# Patient Record
Sex: Female | Born: 1990 | Hispanic: No | Marital: Married | Smoking: Never smoker
Health system: Southern US, Community
[De-identification: ages and names within clinical notes are randomized; demographics above are authoritative.]

## PROBLEM LIST (undated history)

## (undated) DIAGNOSIS — Z789 Other specified health status: Secondary | ICD-10-CM

## (undated) HISTORY — PX: NO PAST SURGERIES: SHX2092

---

## 2011-04-26 NOTE — L&D Delivery Note (Signed)
Delivery Note At 3:29 PM a viable and healthy female was delivered via Vaginal, Spontaneous Delivery (Presentation: ; Occiput Anterior).  APGAR:8,9 ; weight 7 lb 11.5 oz (3500 g).   Placenta status: Intact, Spontaneous.  Cord:  with the following complications: nuchal x1, unable to reduce, delivered through "somersualt'.    Anesthesia: None  Episiotomy: None Lacerations: None Est. Blood Loss (mL): 250  Mom to postpartum.  Baby to nursery-stable.  Lawernce Pitts 01/15/2012, 3:47 PM

## 2011-04-26 NOTE — L&D Delivery Note (Signed)
I was present for the delivery and agree with above.  [place], CNM 01/15/2012 4:14 PM

## 2011-11-30 ENCOUNTER — Other Ambulatory Visit (HOSPITAL_COMMUNITY): Payer: Self-pay | Admitting: Physician Assistant

## 2011-11-30 DIAGNOSIS — Z3689 Encounter for other specified antenatal screening: Secondary | ICD-10-CM

## 2011-11-30 LAB — OB RESULTS CONSOLE HIV ANTIBODY (ROUTINE TESTING): HIV: NONREACTIVE

## 2011-11-30 LAB — OB RESULTS CONSOLE ABO/RH: RH Type: POSITIVE

## 2011-11-30 LAB — OB RESULTS CONSOLE RUBELLA ANTIBODY, IGM
Rubella: IMMUNE
Rubella: NON-IMMUNE/NOT IMMUNE

## 2011-12-09 ENCOUNTER — Encounter (HOSPITAL_COMMUNITY): Payer: Self-pay

## 2011-12-09 ENCOUNTER — Ambulatory Visit (HOSPITAL_COMMUNITY)
Admission: RE | Admit: 2011-12-09 | Discharge: 2011-12-09 | Disposition: A | Discharge status: Home / Self Care | Payer: Medicaid Other | Source: Ambulatory Visit | Attending: Physician Assistant | Admitting: Physician Assistant

## 2011-12-09 DIAGNOSIS — O358XX0 Maternal care for other (suspected) fetal abnormality and damage, not applicable or unspecified: Secondary | ICD-10-CM | POA: Insufficient documentation

## 2011-12-09 DIAGNOSIS — Z363 Encounter for antenatal screening for malformations: Secondary | ICD-10-CM | POA: Insufficient documentation

## 2011-12-09 DIAGNOSIS — Z3689 Encounter for other specified antenatal screening: Secondary | ICD-10-CM

## 2011-12-09 DIAGNOSIS — Z1389 Encounter for screening for other disorder: Secondary | ICD-10-CM | POA: Insufficient documentation

## 2011-12-30 LAB — OB RESULTS CONSOLE GC/CHLAMYDIA: Chlamydia: NEGATIVE

## 2012-01-02 LAB — OB RESULTS CONSOLE GBS: GBS: NEGATIVE

## 2012-01-15 ENCOUNTER — Inpatient Hospital Stay (HOSPITAL_COMMUNITY)
Admission: AD | Admit: 2012-01-15 | Discharge: 2012-01-17 | DRG: 775 | Disposition: A | Discharge status: Home / Self Care | Payer: Medicaid Other | Source: Ambulatory Visit | Attending: Obstetrics and Gynecology | Admitting: Obstetrics and Gynecology

## 2012-01-15 ENCOUNTER — Encounter (HOSPITAL_COMMUNITY): Payer: Self-pay | Admitting: Obstetrics and Gynecology

## 2012-01-15 DIAGNOSIS — Z758 Other problems related to medical facilities and other health care: Secondary | ICD-10-CM

## 2012-01-15 DIAGNOSIS — A749 Chlamydial infection, unspecified: Secondary | ICD-10-CM | POA: Diagnosis not present

## 2012-01-15 DIAGNOSIS — O98819 Other maternal infectious and parasitic diseases complicating pregnancy, unspecified trimester: Secondary | ICD-10-CM | POA: Diagnosis not present

## 2012-01-15 DIAGNOSIS — IMO0001 Reserved for inherently not codable concepts without codable children: Secondary | ICD-10-CM

## 2012-01-15 DIAGNOSIS — Z789 Other specified health status: Secondary | ICD-10-CM

## 2012-01-15 DIAGNOSIS — O0930 Supervision of pregnancy with insufficient antenatal care, unspecified trimester: Secondary | ICD-10-CM

## 2012-01-15 HISTORY — DX: Other specified health status: Z78.9

## 2012-01-15 LAB — POCT FERN TEST: Fern Test: POSITIVE

## 2012-01-15 LAB — CBC
HCT: 31.3 % — ABNORMAL LOW (ref 36.0–46.0)
Hemoglobin: 9.6 g/dL — ABNORMAL LOW (ref 12.0–15.0)
MCHC: 30.7 g/dL (ref 30.0–36.0)
MCV: 75.6 fL — ABNORMAL LOW (ref 78.0–100.0)
RDW: 16.5 % — ABNORMAL HIGH (ref 11.5–15.5)

## 2012-01-15 LAB — TYPE AND SCREEN: Antibody Screen: NEGATIVE

## 2012-01-15 MED ORDER — SIMETHICONE 80 MG PO CHEW: 80.0000 mg | CHEWABLE_TABLET | ORAL | Status: DC | PRN

## 2012-01-15 MED ORDER — ONDANSETRON HCL 4 MG/2ML IJ SOLN: 4.0000 mg | INTRAMUSCULAR | Status: DC | PRN

## 2012-01-15 MED ORDER — ONDANSETRON HCL 4 MG PO TABS: 4.0000 mg | ORAL_TABLET | ORAL | Status: DC | PRN

## 2012-01-15 MED ORDER — FLEET ENEMA 7-19 GM/118ML RE ENEM: 1.0000 | ENEMA | Freq: Every day | RECTAL | Status: DC | PRN

## 2012-01-15 MED ORDER — CITRIC ACID-SODIUM CITRATE 334-500 MG/5ML PO SOLN: 30.0000 mL | ORAL | Status: DC | PRN

## 2012-01-15 MED ORDER — IBUPROFEN 600 MG PO TABS
600.0000 mg | ORAL_TABLET | Freq: Four times a day (QID) | ORAL | Status: DC
  Administered 2012-01-15 – 2012-01-17 (×8): 600 mg via ORAL
  Filled 2012-01-15 (×8): qty 1

## 2012-01-15 MED ORDER — IBUPROFEN 600 MG PO TABS: 600.0000 mg | ORAL_TABLET | Freq: Four times a day (QID) | ORAL | Status: DC | PRN

## 2012-01-15 MED ORDER — OXYCODONE-ACETAMINOPHEN 5-325 MG PO TABS
1.0000 | ORAL_TABLET | ORAL | Status: DC | PRN
  Administered 2012-01-16: 1 via ORAL
  Filled 2012-01-15: qty 1

## 2012-01-15 MED ORDER — HYDROXYZINE HCL 50 MG PO TABS: 50.0000 mg | ORAL_TABLET | Freq: Four times a day (QID) | ORAL | Status: DC | PRN

## 2012-01-15 MED ORDER — OXYCODONE-ACETAMINOPHEN 5-325 MG PO TABS: 1.0000 | ORAL_TABLET | ORAL | Status: DC | PRN

## 2012-01-15 MED ORDER — PRENATAL MULTIVITAMIN CH
1.0000 | ORAL_TABLET | Freq: Every day | ORAL | Status: DC
  Administered 2012-01-15 – 2012-01-16 (×2): 1 via ORAL
  Filled 2012-01-15 (×2): qty 1

## 2012-01-15 MED ORDER — WITCH HAZEL-GLYCERIN EX PADS: 1.0000 "application " | MEDICATED_PAD | CUTANEOUS | Status: DC | PRN

## 2012-01-15 MED ORDER — ONDANSETRON HCL 4 MG/2ML IJ SOLN: 4.0000 mg | Freq: Four times a day (QID) | INTRAMUSCULAR | Status: DC | PRN

## 2012-01-15 MED ORDER — DIPHENHYDRAMINE HCL 25 MG PO CAPS: 25.0000 mg | ORAL_CAPSULE | Freq: Four times a day (QID) | ORAL | Status: DC | PRN

## 2012-01-15 MED ORDER — ZOLPIDEM TARTRATE 5 MG PO TABS: 5.0000 mg | ORAL_TABLET | Freq: Every evening | ORAL | Status: DC | PRN

## 2012-01-15 MED ORDER — OXYTOCIN 40 UNITS IN LACTATED RINGERS INFUSION - SIMPLE MED
62.5000 mL/h | Freq: Once | INTRAVENOUS | Status: DC
  Filled 2012-01-15: qty 1000

## 2012-01-15 MED ORDER — LACTATED RINGERS IV SOLN
INTRAVENOUS | Status: DC
  Administered 2012-01-15 (×2): via INTRAVENOUS

## 2012-01-15 MED ORDER — INFLUENZA VIRUS VACC SPLIT PF IM SUSP
0.5000 mL | INTRAMUSCULAR | Status: AC
  Administered 2012-01-16: 0.5 mL via INTRAMUSCULAR
  Filled 2012-01-15: qty 0.5

## 2012-01-15 MED ORDER — ACETAMINOPHEN 325 MG PO TABS: 650.0000 mg | ORAL_TABLET | ORAL | Status: DC | PRN

## 2012-01-15 MED ORDER — BENZOCAINE-MENTHOL 20-0.5 % EX AERO
1.0000 "application " | INHALATION_SPRAY | CUTANEOUS | Status: DC | PRN
  Filled 2012-01-15: qty 56

## 2012-01-15 MED ORDER — NALBUPHINE SYRINGE 5 MG/0.5 ML
5.0000 mg | INJECTION | INTRAMUSCULAR | Status: DC | PRN
  Administered 2012-01-15: 5 mg via INTRAVENOUS
  Filled 2012-01-15: qty 0.5

## 2012-01-15 MED ORDER — DIBUCAINE 1 % RE OINT: 1.0000 "application " | TOPICAL_OINTMENT | RECTAL | Status: DC | PRN

## 2012-01-15 MED ORDER — LANOLIN HYDROUS EX OINT: TOPICAL_OINTMENT | CUTANEOUS | Status: DC | PRN

## 2012-01-15 MED ORDER — SENNOSIDES-DOCUSATE SODIUM 8.6-50 MG PO TABS
2.0000 | ORAL_TABLET | Freq: Every day | ORAL | Status: DC
  Administered 2012-01-15 – 2012-01-16 (×2): 2 via ORAL

## 2012-01-15 MED ORDER — LIDOCAINE HCL (PF) 1 % IJ SOLN
30.0000 mL | INTRAMUSCULAR | Status: DC | PRN
  Filled 2012-01-15: qty 30

## 2012-01-15 MED ORDER — OXYTOCIN BOLUS FROM INFUSION
500.0000 mL | Freq: Once | INTRAVENOUS | Status: DC
  Filled 2012-01-15: qty 500

## 2012-01-15 MED ORDER — LACTATED RINGERS IV SOLN: 500.0000 mL | INTRAVENOUS | Status: DC | PRN

## 2012-01-15 MED ORDER — TETANUS-DIPHTH-ACELL PERTUSSIS 5-2.5-18.5 LF-MCG/0.5 IM SUSP
0.5000 mL | Freq: Once | INTRAMUSCULAR | Status: AC
  Administered 2012-01-16: 0.5 mL via INTRAMUSCULAR
  Filled 2012-01-15: qty 0.5

## 2012-01-15 MED ORDER — HYDROXYZINE HCL 50 MG/ML IM SOLN: 50.0000 mg | Freq: Four times a day (QID) | INTRAMUSCULAR | Status: DC | PRN

## 2012-01-15 NOTE — H&P (Signed)
Stacey Holloway is a 21 y.o. female presenting for SROM brown fluid at 0300. Brought by EMS. Prenatal care at [facility]. Mild UC's. Pos FM.   Patient Active Problem List  Diagnosis  . Insufficient prenatal care  . Chlamydia infection complicating pregnancy  . Language Barrier     History OB History    Grav Para Term Preterm Abortions TAB SAB Ect Mult Living   2 1 1       1      Past Medical History  Diagnosis Date  . No pertinent past medical history    Past Surgical History  Procedure Date  . No past surgeries    Family History: family history is not on file. Social History:  reports that she has never smoked. She does not have any smokeless tobacco history on file. She reports that she does not drink alcohol or use illicit drugs.   Prenatal Transfer Tool  Maternal Diabetes: No 104 Genetic Screening: Neg CF. Too late for quad. Maternal Ultrasounds/Referrals: Normal Fetal Ultrasounds or other Referrals:  None Maternal Substance Abuse:  No Significant Maternal Medications:  None Significant Maternal Lab Results:  None Other Comments:  Pos CT in pregnancy, Tx. Neg TOC.   Review of Systems  Gastrointestinal: Positive for abdominal pain (contractions).  Musculoskeletal: Positive for back pain (contractions).  Neurological: Negative for headaches.      Blood pressure 105/66, pulse 90, temperature 97.8 F (36.6 C), temperature source Oral, resp. rate 18. Maternal Exam:  Uterine Assessment: Contraction strength is moderate.  Contraction frequency is regular.   Abdomen: Fetal presentation: vertex  Introitus: Normal vulva. Normal vagina.  Ferning test: positive.  Amniotic fluid character: clear.  Pelvis: adequate for delivery.   Cervix: Cervix evaluated by digital exam.     Fetal Exam Fetal Monitor Review: Mode: ultrasound.   Baseline rate: 140.  Variability: moderate (6-25 bpm).   Pattern: accelerations present and no decelerations.    Fetal State  Assessment: Category I - tracings are normal.     Physical Exam  Nursing note and vitals reviewed. Constitutional: She is oriented to person, place, and time. She appears well-developed and well-nourished. She appears distressed (mildly).  HENT:  Head: Normocephalic.  Eyes: Pupils are equal, round, and reactive to light.  Cardiovascular: Normal rate, regular rhythm and normal heart sounds.   Respiratory: Breath sounds normal.  GI: Soft. There is no tenderness.  Genitourinary: Vagina normal.  Musculoskeletal: Normal range of motion. She exhibits no edema and no tenderness.  Neurological: She is alert and oriented to person, place, and time. She has normal reflexes.  Skin: Skin is warm and dry.  Psychiatric: She has a normal mood and affect.  Dilation: 3 Effacement (%): 90 Cervical Position: Anterior Station: -1 Presentation: Vertex Exam by:: Ivonne Andrew, CNM   Prenatal labs: ABO, Rh:  O pos Antibody:  neg Rubella:  Immune RPR:   NR HBsAg:   Neg HIV:   NR GBS: Negative (09/09 0000)  1 hour GTT 104 GC/CT: neg/pos, TOC neg/neg CF carrier neg  Assessment: 1. Labor: Eary 2. Fetal Wellbeing: Category I  3. Pain Control: none 4. GBS: neg 5. 39.5 week IUP  Plan:  1. Admit to BS per consult with MD 2. Routine L&D orders 3. Analgesia/anesthesia PRN    Dorathy Kinsman 01/15/2012, 9:23 AM

## 2012-01-15 NOTE — Progress Notes (Signed)
Language line called to complete pt history

## 2012-01-15 NOTE — H&P (Signed)
Attestation of Attending Supervision of Advanced Practitioner: Evaluation and management procedures were performed by the PA/NP/CNM/OB Fellow under my supervision/collaboration. Chart reviewed and agree with management and plan.  Jerrard Bradburn V 01/15/2012 4:53 PM

## 2012-01-15 NOTE — Progress Notes (Signed)
Stacey Holloway is a 21 y.o. G2P1001 at [redacted]w[redacted]d by LMP admitted for active labor, rupture of membranes  Subjective: More uncomfortable w/ UC's.   Objective: BP 105/63  Pulse 101  Temp 98.1 F (36.7 C) (Oral)  Resp 18  Ht 4\' 11"  (1.499 m)  Wt 69.854 kg (154 lb)  BMI 31.10 kg/m2      FHT:  FHR: 130 bpm, variability: moderate,  accelerations:  Present,  decelerations:  Absent UC:   regular, every 2-4 minutes, moderate SVE:   Dilation: 4 Effacement (%): 80 Station: -2 Exam by:: patti moore rn  Labs: Lab Results  Component Value Date   WBC 18.6* 01/15/2012   HGB 9.6* 01/15/2012   HCT 31.3* 01/15/2012   MCV 75.6* 01/15/2012   PLT 265 01/15/2012    Assessment / Plan: Spontaneous labor, progressing normally  Labor: Progressing normally Preeclampsia:  NA Fetal Wellbeing:  Category I Pain Control:  Labor support without medications I/D:  n/a Anticipated MOD:  NSVD  Stacey Holloway 01/15/2012, 1:02 PM

## 2012-01-15 NOTE — MAU Note (Signed)
Pt presents to MAU with chief complaint of rupture of membranes. Pt is [redacted]w[redacted]d- says her water broke at 0300- brown fluid. Pt was brought here by EMS

## 2012-01-16 ENCOUNTER — Encounter (HOSPITAL_COMMUNITY): Payer: Self-pay | Admitting: *Deleted

## 2012-01-16 NOTE — Progress Notes (Signed)
I have seen this patient and agree with the above resident's note.  LEFTWICH-KIRBY, [place] Condon Certified Nurse-Midwife 

## 2012-01-16 NOTE — Progress Notes (Signed)
International interpreter called for [place] [phone] and used for teaching and discharge information. Also asked to call and notify nurse when baby has a stool.

## 2012-01-16 NOTE — Progress Notes (Signed)
Post Partum Day 1  Subjective: no complaints, up ad lib, voiding, tolerating PO and + flatus  Objective: Blood pressure 96/56, pulse 66, temperature 98.7 F (37.1 C), temperature source Axillary, resp. rate 18, height 4\' 11"  (1.499 m), weight 69.854 kg (154 lb), unknown if currently breastfeeding.  Physical Exam:  General: alert, cooperative, appears stated age and no distress CV: 2+ bilateral DP pulses PULM: CTAB Lochia: appropriate Uterine Fundus: firm Incision: n/a DVT Evaluation: No evidence of DVT seen on physical exam. No cords or calf tenderness. No significant calf/ankle edema.   Basename 01/15/12 1015  HGB 9.6*  HCT 31.3*   Assessment/Plan: Plan for discharge tomorrow, bottle-feeding, planning to use implanon/nexplanon.   LOS: 1 day   Simone Curia 01/16/2012, 9:38 AM

## 2012-01-16 NOTE — Progress Notes (Signed)
Ur chart review completed.  

## 2012-01-17 MED ORDER — IBUPROFEN 600 MG PO TABS: 600.0000 mg | ORAL_TABLET | Freq: Four times a day (QID) | ORAL | Status: DC

## 2012-01-17 NOTE — Plan of Care (Signed)
Problem: Discharge Progression Outcomes Goal: MMR given as ordered Outcome: Not Applicable Date Met:  01/17/12 Patient is rubella immune per prenatal record

## 2012-01-17 NOTE — Discharge Summary (Signed)
Obstetric Discharge Summary Reason for Admission: Spontaneous rupture of membranes in early labor Prenatal Procedures: Fetal monitoring Intrapartum Procedures: Spontaneous vaginal delivery Postpartum Procedures: None Complications-Operative and Postpartum: None  Interview and Exam done through phone interpreter.  Hemoglobin  Date Value Range Status  01/15/2012 9.6* 12.0 - 15.0 g/dL Final     HCT  Date Value Range Status  01/15/2012 31.3* 36.0 - 46.0 % Final    Physical Exam:  General: alert, cooperative and no distress Lochia: appropriate Uterine Fundus: firm Incision: n/a DVT Evaluation: No evidence of DVT seen on physical exam.  Discharge Diagnoses: Term Pregnancy-delivered  Discharge Information: Date: 01/17/2012 Activity: pelvic rest Diet: routine Medications: PNV, Ibuprofen, Colace and Iron Condition: stable Instructions: refer to practice specific booklet Discharge to: home   Newborn Data: Live born female  Birth Weight: 7 lb 11.5 oz (3500 g) APGAR: 8,   Home with mother.  Napoleon Form 01/17/2012, 7:38 AM

## 2013-09-28 ENCOUNTER — Emergency Department (HOSPITAL_COMMUNITY)
Admission: EM | Admit: 2013-09-28 | Discharge: 2013-09-28 | Disposition: A | Discharge status: Home / Self Care | Payer: 59 | Attending: Emergency Medicine | Admitting: Emergency Medicine

## 2013-09-28 ENCOUNTER — Encounter (HOSPITAL_COMMUNITY): Payer: Self-pay | Admitting: Emergency Medicine

## 2013-09-28 ENCOUNTER — Emergency Department (HOSPITAL_COMMUNITY): Payer: 59

## 2013-09-28 DIAGNOSIS — R0602 Shortness of breath: Secondary | ICD-10-CM | POA: Insufficient documentation

## 2013-09-28 DIAGNOSIS — R079 Chest pain, unspecified: Secondary | ICD-10-CM | POA: Insufficient documentation

## 2013-09-28 LAB — BASIC METABOLIC PANEL
BUN: 3 mg/dL — ABNORMAL LOW (ref 6–23)
CALCIUM: 8.5 mg/dL (ref 8.4–10.5)
CHLORIDE: 107 meq/L (ref 96–112)
CO2: 23 meq/L (ref 19–32)
CREATININE: 0.59 mg/dL (ref 0.50–1.10)
GFR calc Af Amer: >90 mL/min (ref >90)
GFR calc non Af Amer: >90 mL/min (ref >90)
GLUCOSE: 93 mg/dL (ref 70–99)
Potassium: 4 mEq/L (ref 3.7–5.3)
Sodium: 141 mEq/L (ref 137–147)

## 2013-09-28 LAB — CBC
HCT: 39 % (ref 36.0–46.0)
HEMOGLOBIN: 12.7 g/dL (ref 12.0–15.0)
MCH: 27.8 pg (ref 26.0–34.0)
MCHC: 32.6 g/dL (ref 30.0–36.0)
MCV: 85.3 fL (ref 78.0–100.0)
Platelets: 196 10*3/uL (ref 150–400)
RBC: 4.57 MIL/uL (ref 3.87–5.11)
RDW: 13.8 % (ref 11.5–15.5)
WBC: 4.8 10*3/uL (ref 4.0–10.5)

## 2013-09-28 LAB — I-STAT TROPONIN, ED: TROPONIN I, POC: 0 ng/mL (ref 0.00–0.08)

## 2013-09-28 LAB — PRO B NATRIURETIC PEPTIDE: PRO B NATRI PEPTIDE: 96.5 pg/mL (ref 0–125)

## 2013-09-28 LAB — D-DIMER, QUANTITATIVE (NOT AT ARMC): D DIMER QUANT: 0.29 ug{FEU}/mL (ref 0.00–0.48)

## 2013-09-28 NOTE — ED Notes (Signed)
Patient refuses wheelchair and is walked to lobby with ED Tech.

## 2013-09-28 NOTE — ED Provider Notes (Signed)
CSN: [id number]     Arrival date & time 09/28/13  1005 History   First MD Initiated Contact with Patient 09/28/13 1110     Chief Complaint  Patient presents with  . Chest Pain     (Consider location/radiation/quality/duration/timing/severity/associated sxs/prior Treatment) HPI Comments: Patient is a 23 year old female who presents today with chest pain and shortness of breath since 8 AM this morning. She can only describe the pain as "pain". She does state the pain radiates from her chest into her back. The pain began at rest. It is associated with shortness of breath. The pain was not pleuritic in nature. The episode resolved spontaneously after 20 minutes. She has never had an episode like this in the past. She denies any long trips, recent surgeries, history of DVT or PE, history of cancer, hemoptysis, leg swelling, exogenous estrogen. She has no family history of early heart disease. She has never seen a cardiologist for any reason. She has never smoked. She denies any drug use.   Patient is a 23 y.o. female presenting with chest pain. The history is provided by the patient. The history is limited by a language barrier. A language interpreter was used.  Chest Pain Associated symptoms: shortness of breath   Associated symptoms: no abdominal pain, no diaphoresis, no fever, no nausea and not vomiting     Past Medical History  Diagnosis Date  . No pertinent past medical history    Past Surgical History  Procedure Laterality Date  . No past surgeries     History reviewed. No pertinent family history. History  Substance Use Topics  . Smoking status: Never Smoker   . Smokeless tobacco: Never Used  . Alcohol Use: No   OB History   Grav Para Term Preterm Abortions TAB SAB Ect Mult Living   2 2 2       2      Review of Systems  Constitutional: Negative for fever, chills and diaphoresis.  Respiratory: Positive for shortness of breath.   Cardiovascular: Positive for chest pain.   Gastrointestinal: Negative for nausea, vomiting and abdominal pain.  All other systems reviewed and are negative.     Allergies  Review of patient's allergies indicates no known allergies.  Home Medications   Prior to Admission medications   Medication Sig Start Date End Date Taking? Authorizing Provider  OVER THE COUNTER MEDICATION Take 1-2 tablets by mouth every 6 (six) hours as needed (headache). Over the counter headache medication   Yes Historical Provider, MD   BP 100/74  Pulse 96  Temp(Src) 97.5 F (36.4 C) (Oral)  Resp 16  Wt 157 lb (71.215 kg)  SpO2 100%  LMP 09/20/2013 Physical Exam  Nursing note and vitals reviewed. Constitutional: She is oriented to person, place, and time. She appears well-developed and well-nourished. No distress.  HENT:  Head: Normocephalic and atraumatic.  Right Ear: External ear normal.  Left Ear: External ear normal.  Nose: Nose normal.  Mouth/Throat: Oropharynx is clear and moist.  Eyes: Conjunctivae are normal.  Neck: Normal range of motion.  Cardiovascular: Normal rate, regular rhythm and normal heart sounds.   Pulmonary/Chest: Effort normal and breath sounds normal. No stridor. No respiratory distress. She has no wheezes. She has no rales.  Abdominal: Soft. She exhibits no distension.  Musculoskeletal: Normal range of motion.  Neurological: She is alert and oriented to person, place, and time. She has normal strength.  Skin: Skin is warm and dry. She is not diaphoretic. No erythema.  Psychiatric:  She has a normal mood and affect. Her behavior is normal.    ED Course  Procedures (including critical care time) Labs Review Labs Reviewed  BASIC METABOLIC PANEL - Abnormal; Notable for the following:    BUN 3 (*)    All other components within normal limits  CBC  PRO B NATRIURETIC PEPTIDE  D-DIMER, QUANTITATIVE  I-STAT TROPOININ, ED    Imaging Review Dg Chest 2 View  09/28/2013   CLINICAL DATA:  Centralized chest pain.  EXAM:  CHEST  2 VIEW  COMPARISON:  No priors.  FINDINGS: Lung volumes are normal. No consolidative airspace disease. No pleural effusions. No pneumothorax. No pulmonary nodule or mass noted. Pulmonary vasculature and the cardiomediastinal silhouette are within normal limits.  IMPRESSION: No radiographic evidence of acute cardiopulmonary disease.   Electronically Signed   By: Trudie Reedaniel  Entrikin M.D.   On: 09/28/2013 12:20     EKG Interpretation   Date/Time:  Saturday September 28 2013 10:10:12 EDT Ventricular Rate:  97 PR Interval:  134 QRS Duration: 82 QT Interval:  364 QTC Calculation: 462 R Axis:   85 Text Interpretation:  Normal sinus rhythm with sinus arrhythmia Normal ECG  No old tracing to compare Confirmed by [facility]  MD, MARTHA [phone] on  09/28/2013 12:56:35 PM      MDM   Final diagnoses:  Chest pain    Patient is to be discharged with recommendation to follow up with PCP in regards to today's hospital visit. Chest pain is not likely of cardiac or pulmonary etiology d/t presentation, negative d dimer, VSS, no tracheal deviation, no JVD or new murmur, RRR, breath sounds equal bilaterally, EKG without acute abnormalities, negative troponin, and negative CXR. Pt has been advised start a PPI and return to the ED is CP becomes exertional, associated with diaphoresis or nausea, radiates to left jaw/arm, worsens or becomes concerning in any way. Pt appears reliable for follow up and is agreeable to discharge.   Case has been discussed with Dr. Karma GanjaLinker who agrees with the above plan to discharge.     Mora BellmanHannah S Gailene Youkhana, PA-C 09/28/13 1306

## 2013-09-28 NOTE — Discharge Instructions (Signed)
Chest Pain (Nonspecific) °It is often hard to give a specific diagnosis for the cause of chest pain. There is always a chance that your pain could be related to something serious, such as a heart attack or a blood clot in the lungs. You need to follow up with your caregiver for further evaluation. °CAUSES  °· Heartburn. °· Pneumonia or bronchitis. °· Anxiety or stress. °· Inflammation around your heart (pericarditis) or lung (pleuritis or pleurisy). °· A blood clot in the lung. °· A collapsed lung (pneumothorax). It can develop suddenly on its own (spontaneous pneumothorax) or from injury (trauma) to the chest. °· Shingles infection (herpes zoster virus). °The chest wall is composed of bones, muscles, and cartilage. Any of these can be the source of the pain. °· The bones can be bruised by injury. °· The muscles or cartilage can be strained by coughing or overwork. °· The cartilage can be affected by inflammation and become sore (costochondritis). °DIAGNOSIS  °Lab tests or other studies, such as X-rays, electrocardiography, stress testing, or cardiac imaging, may be needed to find the cause of your pain.  °TREATMENT  °· Treatment depends on what may be causing your chest pain. Treatment may include: °· Acid blockers for heartburn. °· Anti-inflammatory medicine. °· Pain medicine for inflammatory conditions. °· Antibiotics if an infection is present. °· You may be advised to change lifestyle habits. This includes stopping smoking and avoiding alcohol, caffeine, and chocolate. °· You may be advised to keep your head raised (elevated) when sleeping. This reduces the chance of acid going backward from your stomach into your esophagus. °· Most of the time, nonspecific chest pain will improve within 2 to 3 days with rest and mild pain medicine. °HOME CARE INSTRUCTIONS  °· If antibiotics were prescribed, take your antibiotics as directed. Finish them even if you start to feel better. °· For the next few days, avoid physical  activities that bring on chest pain. Continue physical activities as directed. °· Do not smoke. °· Avoid drinking alcohol. °· Only take over-the-counter or prescription medicine for pain, discomfort, or fever as directed by your caregiver. °· Follow your caregiver's suggestions for further testing if your chest pain does not go away. °· Keep any follow-up appointments you made. If you do not go to an appointment, you could develop lasting (chronic) problems with pain. If there is any problem keeping an appointment, you must call to reschedule. °SEEK MEDICAL CARE IF:  °· You think you are having problems from the medicine you are taking. Read your medicine instructions carefully. °· Your chest pain does not go away, even after treatment. °· You develop a rash with blisters on your chest. °SEEK IMMEDIATE MEDICAL CARE IF:  °· You have increased chest pain or pain that spreads to your arm, neck, jaw, back, or abdomen. °· You develop shortness of breath, an increasing cough, or you are coughing up blood. °· You have severe back or abdominal pain, feel nauseous, or vomit. °· You develop severe weakness, fainting, or chills. °· You have a fever. °THIS IS AN EMERGENCY. Do not wait to see if the pain will go away. Get medical help at once. Call your local emergency services (911 in [place] Do not drive yourself to the hospital. °MAKE SURE YOU:  °· Understand these instructions. °· Will watch your condition. °· Will get help right away if you are not doing well or get worse. °Document Released: 01/19/2005 Document Revised: 07/04/2011 Document Reviewed: 11/15/2007 °ExitCare® Patient Information ©2014 ExitCare,   LLC. ° ° ° °Emergency Department Resource Guide °1) Find a Doctor and Pay Out of Pocket °Although you won't have to find out who is covered by your insurance plan, it is a good idea to ask around and get recommendations. You will then need to call the office and see if the doctor you have chosen will accept you as a new  patient and what types of options they offer for patients who are self-pay. Some doctors offer discounts or will set up payment plans for their patients who do not have insurance, but you will need to ask so you aren't surprised when you get to your appointment. ° °2) Contact Your Local Health Department °Not all health departments have doctors that can see patients for sick visits, but many do, so it is worth a call to see if yours does. If you don't know where your local health department is, you can check in your phone book. The CDC also has a tool to help you locate your state's health department, and many state websites also have listings of all of their local health departments. ° °3) Find a Walk-in Clinic °If your illness is not likely to be very severe or complicated, you may want to try a walk in clinic. These are popping up all over the country in pharmacies, drugstores, and shopping centers. They're usually staffed by nurse practitioners or physician assistants that have been trained to treat common illnesses and complaints. They're usually fairly quick and inexpensive. However, if you have serious medical issues or chronic medical problems, these are probably not your best option. ° °No Primary Care Doctor: °- Call [facility] at  832-8000 - they can help you locate a primary care doctor that  accepts your insurance, provides certain services, etc. °- Physician Referral Service- 1-800-533-3463 ° °Chronic Pain Problems: °Organization         Address  Phone   Notes  °[facility]  (336) 297-2271 Patients need to be referred by their primary care doctor.  ° °Medication Assistance: °Organization         Address  Phone   Notes  °[place] [facility] [address] °[place], [place] [address] (336) 641-8030 --Must be a resident of [place] °-- Must have NO insurance coverage whatsoever (no Medicaid/ Medicare, etc.) °-- The pt. MUST have a primary  care doctor that directs their care regularly and follows them in the community °  °[facility]  (866) 331-1348   °[employer]  (888) 892-1162   ° °Agencies that provide inexpensive medical care: °Organization         Address  Phone   Notes  °[facility]  (336) 832-8035   °[facility]    (336) 832-7272   °[facility] [address] °[place], [place] [address] (336) 832-4777   °[facility] [address], °[place] (336) 271-4999   °[facility]    (336) 373-0678   °[facility]    (336) 272-1050   °[facility] ° [address], [place] Phone:  (336) 832-4444, Fax:  (336) 832-4440 Hours of Operation:  9 am - 6 pm, M-F.  Also accepts Medicaid/Medicare and self-pay.  °[facility] ° [address], [place] Phone: (336) 832-3150, Fax: (336) 832-3151. Hours of Operation:  8:30 am - 5:30 pm, M-F.  Also accepts Medicaid and self-pay.  °  [facility] [place] [address], [place] Phone: (336) 878-6027   °[facility] [address], [place], [place] (336)723-1848, Ext. 123 Mondays & Thursdays: 7-9 AM.  First 15 patients are seen on a first come, first serve basis. °  ° °Medicaid-accepting [place] Providers: ° °Organization         Address  Phone   Notes  °[facility] [address], [place] (336) 641-2100 Also accepts self-pay patients.  °[facility] [address], [place] ° (336) 856-9996   °[facility] [address], [place] (336) 288-8857   °[facility] [address] [place] [address], [place] (336) 299-7000   °Veita Bland [address], [place]  ° (336) 373-1557 Only accepts [place] Access Medicaid patients after they have their name applied to their card.  ° °Self-Pay (no insurance) in [place]: ° °Organization         Address  Phone   Notes  °Sickle Cell Patients, [facility] [address], [place] (336) 832-1970   °[facility] [address], [place] (336) 832-4400   °[facility] ° [address], [place] (336) 992-4800   °[facility]/Dr. Osei-Bonsu ° [address] [place] [address], [place] or [address], [place] (336) 841-8500 Phone number for both [place] and [place] locations is the same.  °[facility] [address], [place] (336) 299-0000   °[facility] [address] [place] [address], [place] or [address] (336) 852-7530 °(336) 878-2260   °[facility] [address], [place] (336) 350-1642, phone; (336) 294-5005, fax Sees patients 1st and 3rd Saturday of every month.  Must not qualify for public or private insurance (i.e. Medicaid, Medicare, [place] Health Choice, Veterans' Benefits) • Household income should be no more than 200% of the poverty level •The clinic cannot treat you if you are pregnant or think you are pregnant • Sexually transmitted diseases are not treated at the clinic.  ° ° °Dental Care: °Organization         Address  Phone  Notes  °[facility] [address], [place] (336) 641-6152 Accepts children up to age 21 who are enrolled in Medicaid or [place] Health Choice; pregnant women with a Medicaid card; and children who have applied for Medicaid or [place] Health Choice, but were declined, whose parents can pay a reduced fee at time of service.  °[facility] [place]  [address], [place] (336) 641-7733 Accepts children up to age 21 who are enrolled in Medicaid or [place] Health Choice; pregnant women with a Medicaid card; and children who have applied for Medicaid or [place] Health Choice, but were declined, whose parents can  pay a reduced fee at time of service.  °[facility] ° [address], [place] (336) 641-4533 Patients are seen by appointment only. Walk-ins are not accepted. [facility] will see patients 18 years of age and older. °Monday - Tuesday (8am-5pm) °Most Wednesdays (8:30-5pm) °$30 per visit, cash only  °[facility] ° [address], [place] (336) 641-4533 Patients are seen by appointment only. Walk-ins are not accepted. [facility] will see patients 18 years of   age and older. °One Wednesday Evening (Monthly: Volunteer Based).  $30 per visit, cash only  °[facility]  (919) 537-3737 for adults; Children under age 4, call Graduate Pediatric Dentistry at (919) [phone]. Children aged 4-14, please call (919) [phone] to request a pediatric application. ° Dental services are provided in all areas of dental care including fillings, crowns and bridges, complete and partial dentures, implants, gum treatment, root canals, and extractions. Preventive care is also provided. Treatment is provided to both adults and children. °Patients are selected via a lottery and there is often a waiting list. °  °[facility] [address], °[place] ° (336) 763-8833 [email or web address] °  °[facility] [address], [place], [place] (336)723-1848, Ext. 123 Second and Fourth Thursday of each month, opens at 6:30 AM; Clinic ends at 9 AM.  Patients are seen on a first-come first-served basis, and a limited number are seen during each clinic.  ° °[facility] ° [address], [place], [place] (336) 723-7904   Eligibility Requirements °You must have lived in [place], [place], or [place] counties for at least the last three months. °  You cannot be eligible for state or federal sponsored healthcare insurance, including Veterans Administration, Medicaid, or Medicare. °  You generally cannot be eligible for healthcare  insurance through your employer.  °  How to apply: °Eligibility screenings are held every Tuesday and Wednesday afternoon from 1:00 pm until 4:00 pm. You do not need an appointment for the interview!  °[facility] [address], [place], [place] 336-631-2330   °[facility]  336-342-8273   °[facility]  336-703-3100   °[facility]  336-570-6415   ° °Behavioral Health Resources in the Community: °Intensive Outpatient Programs °Organization         Address  Phone  Notes  °[facility] [address], [place], [place] 336-878-6098   °[facility] [address], [place], [place] 336-832-9800   °[facility] [address], [place], [place] ° 336-882-2125   °[facility] [address],  °[place], [place] 1-800-853-5163 or 336-641-4981   °Substance Abuse Resources °Organization         Address  Phone  Notes  °[facility]  336-882-2125   °[facility]  336-784-9470   °[facility]  336-285-9073   °[facility]  336-845-3988   °Residential & Outpatient Substance Abuse Program  1-800-659-3381   °Psychological Services °Organization         Address  Phone  Notes  °[facility]  336- 832-9600   °[facility]  336- 378-7881   °[facility] [address], [place] 1-800-853-5163 or 336-641-4981   ° °Mobile Crisis Teams °Organization         Address  Phone  Notes  °[facility], Mobile Crisis Care Unit  1-877-626-1772   °Assertive °[facility] ° [address] [place], [place] 336-834-9664   °Sharon DeEsch [address] °[place] 336-554-5454   ° °Self-Help/Support Groups °Organization         Address  Phone             Notes  °[facility]  - variety of support groups  336- 373-1402 Call for more information  °Narcotics Anonymous (NA),  [facility] [address], °[place]   2   meetings at this location  ° °Residential Treatment Programs °Organization         Address  Phone  Notes  °[facility] [address],    °[place]  1-866-801-8205   °[facility] ° [address], [place], [place] 704-293-8524   °[facility] [address], [place] 336-845-3988 Admissions: 8am-3pm M-F  °[facility] [address]    °[place], [place] 336-841-1104   °[facility] [address], [place], [place] 336-379-7146   °[facility] [address]  °[place], [place] 336-285-9073   °[facility] Intensive Outpatient [address], [place], [place] 336-852-3033   °[facility] [address]  °[place], [place] 1-877-615-2722 or 336-784-9470   °[facility] ([facility] [address] [place], [place] 336-227-7417 Accepts Medicaid  °[facility] [address]  °[place] 1-800-659-3381 Substance Abuse/Addiction Treatment  ° °[place] Behavioral Health Resources °Organization         Address  Phone  Notes  °[facility]  (888) 581-9988   °Julie Brannon, PhD [address] [place], [place]   (336) 349-5553 or (336) 951-0000   °[facility]   [address] °[place], [place] (336) 349-4454   °[facility] [address], [place], [place] (336) 342-8316 Insurance/Medicaid/sponsorship through [facility]  °[facility] [address]                                    [place], [place] (336) 342-8316 Therapy/tele-psych/case  °[facility] [address]  ° [place], [place] (336) 349-2233    °Dr. Arfeen  (336) 349-4544   °[facility] [place]  [employer] [place] [facility] 1) [address], [place] °2) [address], [place] °3)  [address], [place] (336) 349-3220 °(336) 342-7768 ° °(336) 342-8140    °[place] [facility] (336) 342-1394 or (336) 342-3537 (After Hours)    ° ° ° °

## 2013-09-28 NOTE — ED Notes (Signed)
Phlebotomy and pharmacy in room at this time.

## 2013-09-28 NOTE — ED Provider Notes (Signed)
Medical screening examination/treatment/procedure(s) were performed by non-physician practitioner and as supervising physician I was immediately available for consultation/collaboration.   EKG Interpretation   Date/Time:  Saturday September 28 2013 10:10:12 EDT Ventricular Rate:  97 PR Interval:  134 QRS Duration: 82 QT Interval:  364 QTC Calculation: 462 R Axis:   85 Text Interpretation:  Normal sinus rhythm with sinus arrhythmia Normal ECG  No old tracing to compare Confirmed by [facility]  MD, Lakoda Mcanany [id number]) on  09/28/2013 12:56:35 PM       Ethelda Chick, MD 09/28/13 1310

## 2013-09-28 NOTE — ED Notes (Addendum)
Shes had chest pain and "feels hard to breathe" since yesterday. She denies any medical history. she speaks nepali

## 2014-02-24 ENCOUNTER — Encounter (HOSPITAL_COMMUNITY): Payer: Self-pay | Admitting: Emergency Medicine

## 2014-04-25 NOTE — L&D Delivery Note (Cosign Needed)
Delivery Note At 9:18 PM a viable and healthy female was delivered via Vaginal, Spontaneous Delivery (Presentation: Left Occiput Anterior).  APGAR: 9, 9; weight  .   Placenta status: Intact, Spontaneous.  Cord: 3 vessels with the following complications: None.   Anesthesia: None  Episiotomy: None Lacerations: None Suture Repair: n/a Est. Blood Loss (mL):  250mL  Mom to postpartum.  Baby to Couplet care / Skin to Skin. Viable female delivered over intact perineum. Mildly stunned but cried with vigorous stimulation and suctioning.   [name], Elena 07/25/2014, 10:14 PM   Patient is a G3P2001 (death of one child) at [id number] who was admitted w/ SOL, uncomplicated prenatal course.  She progressed to SVD shortly after admission.  I was gloved and present for delivery in its entirety.  Second stage of labor progressed, baby delivered after a few contractions.    Complications: none  Lacerations: none  EBL: 250cc  Bostyn Kunkler, CNM 11:14 PM

## 2014-04-28 ENCOUNTER — Other Ambulatory Visit (HOSPITAL_COMMUNITY): Payer: Self-pay | Admitting: Nurse Practitioner

## 2014-04-28 DIAGNOSIS — Z3689 Encounter for other specified antenatal screening: Secondary | ICD-10-CM

## 2014-04-28 LAB — OB RESULTS CONSOLE HEPATITIS B SURFACE ANTIGEN: Hepatitis B Surface Ag: NEGATIVE

## 2014-04-28 LAB — OB RESULTS CONSOLE ANTIBODY SCREEN: ANTIBODY SCREEN: NEGATIVE

## 2014-04-28 LAB — OB RESULTS CONSOLE HIV ANTIBODY (ROUTINE TESTING): HIV: NONREACTIVE

## 2014-04-28 LAB — OB RESULTS CONSOLE RPR: RPR: NONREACTIVE

## 2014-04-28 LAB — OB RESULTS CONSOLE GC/CHLAMYDIA
CHLAMYDIA, DNA PROBE: NEGATIVE
Gonorrhea: NEGATIVE

## 2014-04-28 LAB — OB RESULTS CONSOLE ABO/RH: RH TYPE: POSITIVE

## 2014-04-28 LAB — OB RESULTS CONSOLE RUBELLA ANTIBODY, IGM: Rubella: IMMUNE

## 2014-05-02 ENCOUNTER — Other Ambulatory Visit (HOSPITAL_COMMUNITY): Payer: Self-pay | Admitting: Nurse Practitioner

## 2014-05-02 ENCOUNTER — Ambulatory Visit (HOSPITAL_COMMUNITY)
Admission: RE | Admit: 2014-05-02 | Discharge: 2014-05-02 | Disposition: A | Discharge status: Home / Self Care | Payer: 59 | Source: Ambulatory Visit | Attending: Nurse Practitioner | Admitting: Nurse Practitioner

## 2014-05-02 ENCOUNTER — Encounter (HOSPITAL_COMMUNITY): Payer: Self-pay

## 2014-05-02 DIAGNOSIS — Z3A27 27 weeks gestation of pregnancy: Secondary | ICD-10-CM | POA: Insufficient documentation

## 2014-05-02 DIAGNOSIS — Z36 Encounter for antenatal screening of mother: Secondary | ICD-10-CM | POA: Insufficient documentation

## 2014-05-02 DIAGNOSIS — Z3689 Encounter for other specified antenatal screening: Secondary | ICD-10-CM | POA: Insufficient documentation

## 2014-05-02 DIAGNOSIS — O402XX0 Polyhydramnios, second trimester, not applicable or unspecified: Secondary | ICD-10-CM | POA: Insufficient documentation

## 2014-05-07 ENCOUNTER — Other Ambulatory Visit (HOSPITAL_COMMUNITY): Payer: Self-pay | Admitting: Nurse Practitioner

## 2014-05-07 DIAGNOSIS — Z0371 Encounter for suspected problem with amniotic cavity and membrane ruled out: Secondary | ICD-10-CM

## 2014-05-07 DIAGNOSIS — Z3689 Encounter for other specified antenatal screening: Secondary | ICD-10-CM

## 2014-05-23 ENCOUNTER — Ambulatory Visit (HOSPITAL_COMMUNITY)
Admission: RE | Admit: 2014-05-23 | Discharge: 2014-05-23 | Disposition: A | Discharge status: Home / Self Care | Payer: 59 | Source: Ambulatory Visit | Attending: Nurse Practitioner | Admitting: Nurse Practitioner

## 2014-05-23 DIAGNOSIS — Z3A30 30 weeks gestation of pregnancy: Secondary | ICD-10-CM | POA: Insufficient documentation

## 2014-05-23 DIAGNOSIS — Z0371 Encounter for suspected problem with amniotic cavity and membrane ruled out: Secondary | ICD-10-CM

## 2014-05-23 DIAGNOSIS — IMO0002 Reserved for concepts with insufficient information to code with codable children: Secondary | ICD-10-CM | POA: Insufficient documentation

## 2014-05-23 DIAGNOSIS — Z0489 Encounter for examination and observation for other specified reasons: Secondary | ICD-10-CM | POA: Insufficient documentation

## 2014-05-23 DIAGNOSIS — Z3689 Encounter for other specified antenatal screening: Secondary | ICD-10-CM

## 2014-07-04 LAB — OB RESULTS CONSOLE GBS: STREP GROUP B AG: NEGATIVE

## 2014-07-25 ENCOUNTER — Inpatient Hospital Stay (HOSPITAL_COMMUNITY)
Admission: AD | Admit: 2014-07-25 | Discharge: 2014-07-27 | DRG: 775 | Disposition: A | Discharge status: Home / Self Care | Payer: Medicaid Other | Source: Ambulatory Visit | Attending: Obstetrics & Gynecology | Admitting: Obstetrics & Gynecology

## 2014-07-25 ENCOUNTER — Encounter (HOSPITAL_COMMUNITY): Payer: Self-pay

## 2014-07-25 ENCOUNTER — Encounter (HOSPITAL_COMMUNITY): Payer: Self-pay | Admitting: *Deleted

## 2014-07-25 ENCOUNTER — Inpatient Hospital Stay (HOSPITAL_COMMUNITY)
Admission: AD | Admit: 2014-07-25 | Discharge: 2014-07-25 | Disposition: A | Discharge status: Home / Self Care | Payer: Medicaid Other | Source: Ambulatory Visit | Attending: Obstetrics & Gynecology | Admitting: Obstetrics & Gynecology

## 2014-07-25 DIAGNOSIS — Z3A39 39 weeks gestation of pregnancy: Secondary | ICD-10-CM | POA: Diagnosis not present

## 2014-07-25 DIAGNOSIS — O4103X0 Oligohydramnios, third trimester, not applicable or unspecified: Principal | ICD-10-CM | POA: Diagnosis present

## 2014-07-25 DIAGNOSIS — IMO0001 Reserved for inherently not codable concepts without codable children: Secondary | ICD-10-CM

## 2014-07-25 HISTORY — DX: Other specified health status: Z78.9

## 2014-07-25 LAB — TYPE AND SCREEN
ABO/RH(D): O POS
Antibody Screen: NEGATIVE

## 2014-07-25 LAB — CBC
HCT: 30.3 % — ABNORMAL LOW (ref 36.0–46.0)
Hemoglobin: 9.4 g/dL — ABNORMAL LOW (ref 12.0–15.0)
MCH: 23.6 pg — ABNORMAL LOW (ref 26.0–34.0)
MCHC: 31 g/dL (ref 30.0–36.0)
MCV: 76.1 fL — ABNORMAL LOW (ref 78.0–100.0)
Platelets: 243 10*3/uL (ref 150–400)
RBC: 3.98 MIL/uL (ref 3.87–5.11)
RDW: 16.5 % — ABNORMAL HIGH (ref 11.5–15.5)
WBC: 17.5 10*3/uL — ABNORMAL HIGH (ref 4.0–10.5)

## 2014-07-25 LAB — AMNISURE RUPTURE OF MEMBRANE (ROM) NOT AT ARMC: Amnisure ROM: NEGATIVE

## 2014-07-25 MED ORDER — OXYCODONE-ACETAMINOPHEN 5-325 MG PO TABS
1.0000 | ORAL_TABLET | ORAL | Status: DC | PRN
  Administered 2014-07-26: 1 via ORAL
  Filled 2014-07-25: qty 1

## 2014-07-25 MED ORDER — LIDOCAINE HCL (PF) 1 % IJ SOLN
30.0000 mL | INTRAMUSCULAR | Status: DC | PRN
  Filled 2014-07-25: qty 30

## 2014-07-25 MED ORDER — LANOLIN HYDROUS EX OINT: TOPICAL_OINTMENT | CUTANEOUS | Status: DC | PRN

## 2014-07-25 MED ORDER — OXYTOCIN 40 UNITS IN LACTATED RINGERS INFUSION - SIMPLE MED
62.5000 mL/h | INTRAVENOUS | Status: DC
  Administered 2014-07-25: 62.5 mL/h via INTRAVENOUS
  Filled 2014-07-25: qty 1000

## 2014-07-25 MED ORDER — PRENATAL MULTIVITAMIN CH
1.0000 | ORAL_TABLET | Freq: Every day | ORAL | Status: DC
  Administered 2014-07-27: 1 via ORAL
  Filled 2014-07-25 (×2): qty 1

## 2014-07-25 MED ORDER — WITCH HAZEL-GLYCERIN EX PADS: 1.0000 "application " | MEDICATED_PAD | CUTANEOUS | Status: DC | PRN

## 2014-07-25 MED ORDER — IBUPROFEN 600 MG PO TABS
600.0000 mg | ORAL_TABLET | Freq: Four times a day (QID) | ORAL | Status: DC
  Administered 2014-07-25 – 2014-07-27 (×6): 600 mg via ORAL
  Filled 2014-07-25 (×7): qty 1

## 2014-07-25 MED ORDER — OXYCODONE-ACETAMINOPHEN 5-325 MG PO TABS: 2.0000 | ORAL_TABLET | ORAL | Status: DC | PRN

## 2014-07-25 MED ORDER — BENZOCAINE-MENTHOL 20-0.5 % EX AERO
1.0000 "application " | INHALATION_SPRAY | CUTANEOUS | Status: DC | PRN
  Administered 2014-07-25: 1 via TOPICAL
  Filled 2014-07-25: qty 56

## 2014-07-25 MED ORDER — ZOLPIDEM TARTRATE 5 MG PO TABS: 5.0000 mg | ORAL_TABLET | Freq: Every evening | ORAL | Status: DC | PRN

## 2014-07-25 MED ORDER — CITRIC ACID-SODIUM CITRATE 334-500 MG/5ML PO SOLN: 30.0000 mL | ORAL | Status: DC | PRN

## 2014-07-25 MED ORDER — ACETAMINOPHEN 325 MG PO TABS: 650.0000 mg | ORAL_TABLET | ORAL | Status: DC | PRN

## 2014-07-25 MED ORDER — LACTATED RINGERS IV SOLN
INTRAVENOUS | Status: DC
  Administered 2014-07-25: 20:00:00 via INTRAVENOUS

## 2014-07-25 MED ORDER — SENNOSIDES-DOCUSATE SODIUM 8.6-50 MG PO TABS
2.0000 | ORAL_TABLET | ORAL | Status: DC
Start: 2014-07-26 — End: 2014-07-27
  Administered 2014-07-25: 2 via ORAL
  Filled 2014-07-25: qty 2

## 2014-07-25 MED ORDER — ONDANSETRON HCL 4 MG PO TABS: 4.0000 mg | ORAL_TABLET | ORAL | Status: DC | PRN

## 2014-07-25 MED ORDER — TETANUS-DIPHTH-ACELL PERTUSSIS 5-2.5-18.5 LF-MCG/0.5 IM SUSP: 0.5000 mL | Freq: Once | INTRAMUSCULAR | Status: DC

## 2014-07-25 MED ORDER — ONDANSETRON HCL 4 MG/2ML IJ SOLN
4.0000 mg | INTRAMUSCULAR | Status: DC | PRN
Start: 2014-07-25 — End: 2014-07-27

## 2014-07-25 MED ORDER — DIBUCAINE 1 % RE OINT: 1.0000 "application " | TOPICAL_OINTMENT | RECTAL | Status: DC | PRN

## 2014-07-25 MED ORDER — FLEET ENEMA 7-19 GM/118ML RE ENEM: 1.0000 | ENEMA | RECTAL | Status: DC | PRN

## 2014-07-25 MED ORDER — SIMETHICONE 80 MG PO CHEW: 80.0000 mg | CHEWABLE_TABLET | ORAL | Status: DC | PRN

## 2014-07-25 MED ORDER — LACTATED RINGERS IV SOLN: 500.0000 mL | INTRAVENOUS | Status: DC | PRN

## 2014-07-25 MED ORDER — ONDANSETRON HCL 4 MG/2ML IJ SOLN: 4.0000 mg | Freq: Four times a day (QID) | INTRAMUSCULAR | Status: DC | PRN

## 2014-07-25 MED ORDER — DIPHENHYDRAMINE HCL 25 MG PO CAPS: 25.0000 mg | ORAL_CAPSULE | Freq: Four times a day (QID) | ORAL | Status: DC | PRN

## 2014-07-25 MED ORDER — OXYTOCIN BOLUS FROM INFUSION
500.0000 mL | INTRAVENOUS | Status: DC
  Administered 2014-07-25: 500 mL via INTRAVENOUS

## 2014-07-25 MED ORDER — OXYCODONE-ACETAMINOPHEN 5-325 MG PO TABS: 1.0000 | ORAL_TABLET | ORAL | Status: DC | PRN

## 2014-07-25 NOTE — MAU Note (Signed)
Pt here via EMS for possible ROM.

## 2014-07-25 NOTE — Progress Notes (Signed)
LClemmons, CNM notified of pt's c/o ?ROM, [facility]

## 2014-07-25 NOTE — Discharge Instructions (Signed)
Third Trimester of Pregnancy The third trimester is from week 29 through week 42, months 7 through 9. This trimester is when your unborn baby (fetus) is growing very fast. At the end of the ninth month, the unborn baby is about 20 inches in length. It weighs about 6-10 pounds.  HOME CARE   Avoid all smoking, herbs, and alcohol. Avoid drugs not approved by your doctor.  Only take medicine as told by your doctor. Some medicines are safe and some are not during pregnancy.  Exercise only as told by your doctor. Stop exercising if you start having cramps.  Eat regular, healthy meals.  Wear a good support bra if your breasts are tender.  Do not use hot tubs, steam rooms, or saunas.  Wear your seat belt when driving.  Avoid raw meat, uncooked cheese, and liter boxes and soil used by cats.  Take your prenatal vitamins.  Try taking medicine that helps you poop (stool softener) as needed, and if your doctor approves. Eat more fiber by eating fresh fruit, vegetables, and whole grains. Drink enough fluids to keep your pee (urine) clear or pale yellow.  Take warm water baths (sitz baths) to soothe pain or discomfort caused by hemorrhoids. Use hemorrhoid cream if your doctor approves.  If you have puffy, bulging veins (varicose veins), wear support hose. Raise (elevate) your feet for 15 minutes, 3-4 times a day. Limit salt in your diet.  Avoid heavy lifting, wear low heels, and sit up straight.  Rest with your legs raised if you have leg cramps or low back pain.  Visit your dentist if you have not gone during your pregnancy. Use a soft toothbrush to brush your teeth. Be gentle when you floss.  You can have sex (intercourse) unless your doctor tells you not to.  Do not travel far distances unless you must. Only do so with your doctor's approval.  Take prenatal classes.  Practice driving to the hospital.  Pack your hospital bag.  Prepare the baby's room.  Go to your doctor visits. GET  HELP IF:  You are not sure if you are in labor or if your water has broken.  You are dizzy.  You have mild cramps or pressure in your lower belly (abdominal).  You have a nagging pain in your belly area.  You continue to feel sick to your stomach (nauseous), throw up (vomit), or have watery poop (diarrhea).  You have bad smelling fluid coming from your vagina.  You have pain with peeing (urination). GET HELP RIGHT AWAY IF:   You have a fever.  You are leaking fluid from your vagina.  You are spotting or bleeding from your vagina.  You have severe belly cramping or pain.  You lose or gain weight rapidly.  You have trouble catching your breath and have chest pain.  You notice sudden or extreme puffiness (swelling) of your face, hands, ankles, feet, or legs.  You have not felt the baby move in over an hour.  You have severe headaches that do not go away with medicine.  You have vision changes. Document Released: 07/06/2009 Document Revised: 08/06/2012 Document Reviewed: 06/12/2012 ExitCare Patient Information 2015 [employer]. This information is not intended to replace advice given to you by your health care provider. Make sure you discuss any questions you have with your health care provider.  

## 2014-07-25 NOTE — MAU Note (Signed)
Patient was seen in MAU earlier today and came back by ambulance for worsening contractions.

## 2014-07-25 NOTE — H&P (Signed)
LABOR ADMISSION HISTORY AND PHYSICAL  Stacey Holloway is a 24 y.o. female G64P2002 with spontaneous onset of labor at [redacted]w[redacted]d by LMP confirmed by [place]. She reports +FMs, No LOF, no VB, no blurry vision, headaches or peripheral edema, and RUQ pain.  Pt noticed increased mucous discharge this morning.  She plans on bottle feeding. She request OCP for birth control.  Dating: By LMP confirmed by [place] --->  Estimated Date of Delivery: 07/31/14  Sono:    , CWD, normal anatomy, breech presentation, 1623g, 64% EFW   Prenatal History/Complications:  Past Medical History: Past Medical History  Diagnosis Date  . No pertinent past medical history     Past Surgical History: Past Surgical History  Procedure Laterality Date  . No past surgeries      Obstetrical History: OB History    Gravida Para Term Preterm AB TAB SAB Ectopic Multiple Living   Social History: History   Social History  . Marital Status: Married    Spouse Name: N/A  . Number of Children: N/A  . Years of Education: N/A   Social History Main Topics  . Smoking status: Never Smoker   . Smokeless tobacco: Never Used  . Alcohol Use: No  . Drug Use: No  . Sexual Activity: Yes    Birth Control/ Protection: None   Other Topics Concern  . None   Social History Narrative    Family History: History reviewed. No pertinent family history.  Allergies: No Known Allergies  Prescriptions prior to admission  Medication Sig Dispense Refill Last Dose  . Prenatal Vit-Fe Fumarate-FA (PRENATAL MULTIVITAMIN) TABS tablet Take 1 tablet by mouth daily at 12 noon.   07/24/2014 at Unknown time     Review of Systems   All systems reviewed and negative except as stated in HPI  Blood pressure 113/90, pulse 119, temperature 99 F (37.2 C), temperature source Oral, last menstrual period 09/20/2013. General appearance: alert and moderate distress Lungs: clear to auscultation bilaterally Heart: regular rate and  rhythm Abdomen: soft, non-tender; bowel sounds normal Extremities: Homans sign is negative, no sign of DVT DTR's +2 bilateral patellar reflex Presentation: unsure Fetal monitoringBaseline: 140 bpm, Variability: Good {> 6 bpm), Accelerations: Reactive and Decelerations: Absent Uterine activityFrequency: Every 4-5 minutes Dilation: 7 Effacement (%): 100 Station: -3 Exam by:: Stacey Holloway, rnc   Prenatal labs: ABO, Rh: O/Positive/-- (01/04 0000) Antibody: Negative (01/04 0000) Rubella:  Immune RPR: Nonreactive (01/04 0000)  HBsAg: Negative (01/04 0000)  HIV: Non-reactive (01/04 0000)  GBS: Negative (03/11 0000)  1 hr Glucola: normal (103) Genetic screening  CF nl Anatomy US normal Pediatrician: [facility]  Prenatal Transfer Tool  Maternal Diabetes: No Genetic Screening: Normal Maternal Ultrasounds/Referrals: Normal Fetal Ultrasounds or other Referrals:  None Maternal Substance Abuse:  No Significant Maternal Medications:  None Significant Maternal Lab Results: Lab values include: Group B Strep negative  Results for orders placed or performed during the hospital encounter of 07/25/14 (from the past 24 hour(s))  Amnisure rupture of membrane (rom)   Collection Time: 07/25/14  2:30 PM  Result Value Ref Range   Amnisure ROM NEGATIVE     Patient Active Problem List   Diagnosis Date Noted  . Encounter for suspected problem with amniotic cavity and membrane not found   . [redacted] weeks gestation of pregnancy   . Evaluate anatomy not seen on prior sonogram   . Encounter for fetal anatomic survey   .  Polyhydramnios in second trimester, antepartum complication   . [redacted] weeks gestation of pregnancy   . Insufficient prenatal care 01/15/2012  . Chlamydia infection complicating pregnancy 01/15/2012  . Language barrier 01/15/2012    Assessment: Stacey Holloway is a 24 y.o. G3P2002 at 4467w1d here for spontaneous onset of labor.  Pt speaks [place]ali, interpreter [id number]  #Labor: expectant management,  anticipate vaginal delivery  #Pain: labor progressing without medication  #FWB: Category I  #ID:  GBS negative  #MOF: Bottle #MOC: OCP  Stacey Holloway 07/25/2014, 7:54 PM

## 2014-07-26 ENCOUNTER — Encounter (HOSPITAL_COMMUNITY): Payer: Self-pay | Admitting: *Deleted

## 2014-07-26 LAB — RPR: RPR Ser Ql: NONREACTIVE

## 2014-07-26 LAB — HIV ANTIBODY (ROUTINE TESTING W REFLEX): HIV Screen 4th Generation wRfx: NONREACTIVE

## 2014-07-26 NOTE — Progress Notes (Signed)
Post Partum Day 1 Subjective: no complaints and up ad lib  Objective: Blood pressure 95/49, pulse 73, temperature 98.3 F (36.8 C), temperature source Oral, resp. rate 18, height 4\' 11"  (1.499 m), weight 79.833 kg (176 lb), last menstrual period 09/20/2013, SpO2 100 %, unknown if currently breastfeeding.  Physical Exam:  General: alert, cooperative and no distress Lochia: appropriate Uterine Fundus: firm Incision: n/a DVT Evaluation: No evidence of DVT seen on physical exam. No cords or calf tenderness. No significant calf/ankle edema.   Recent Labs  07/25/14 2000  HGB 9.4*  HCT 30.3*    Assessment/Plan: Plan for discharge tomorrow and Contraception OCPs. Bottle feeding and wants outpatient circ.   LOS: 1 day   Stacey Holloway, Stacey Holloway 07/26/2014, 7:31 AM   I have seen and examined this patient and I agree with the above. Cam HaiSHAW, KIMBERLY CNM 8:39 AM 07/26/2014

## 2014-07-26 NOTE — Progress Notes (Signed)
[place] interpreter from [employer] [phone] on phone with patient at bedside. Baby care and patient care education reviewed with patient. Boykin PeekNancy Hershy Flenner, RN

## 2014-07-27 MED ORDER — IBUPROFEN 600 MG PO TABS: 600.0000 mg | ORAL_TABLET | Freq: Four times a day (QID) | ORAL | Status: DC

## 2014-07-27 MED ORDER — NORGESTIMATE-ETH ESTRADIOL 0.25-35 MG-MCG PO TABS: 1.0000 | ORAL_TABLET | Freq: Every day | ORAL | Status: DC

## 2014-07-27 NOTE — Progress Notes (Signed)
Discharge instructions including review of prescribed home medications and home baby care reviewed with patient and family at bedside with [facility] [employer] [phone]. Per interpreter, patient has understanding of instructions and no further questions at this time. Boykin PeekNancy Mayo Faulk, RN

## 2014-07-27 NOTE — Discharge Summary (Signed)
Obstetric Discharge Summary Reason for Admission: onset of labor Prenatal Procedures: NST Intrapartum Procedures: spontaneous vaginal delivery Postpartum Procedures: none Complications-Operative and Postpartum: none HEMOGLOBIN  Date Value Ref Range Status  07/25/2014 9.4* 12.0 - 15.0 g/dL Final   HCT  Date Value Ref Range Status  07/25/2014 30.3* 36.0 - 46.0 % Final    Physical Exam:  General: alert, cooperative and no distress Lochia: appropriate Uterine Fundus: firm Incision: n/a DVT Evaluation: No evidence of DVT seen on physical exam. No cords or calf tenderness. No significant calf/ankle edema.  Discharge Diagnoses: Term Pregnancy-delivered  Discharge Information: Date: 07/27/2014 Activity: pelvic rest Diet: routine Medications: PNV and Ibuprofen Condition: stable Instructions: refer to practice specific booklet Discharge to: home Follow-up Information    [facility] [place]. Schedule an appointment as soon as possible for a visit in 6 weeks.   Contact information:   [address] [place] [phone]      Newborn Data: Live born female  Birth Weight: 7 lb 4.2 oz (3294 g) APGAR: 9, 9  Home with mother.  [name], Elena 07/27/2014, 7:16 AM   See also by me Agree with note Patient is deemed ready for discharge Aviva SignsMarie L Williams, CNM

## 2014-07-27 NOTE — Discharge Instructions (Signed)

## 2014-07-28 NOTE — Progress Notes (Signed)
Ur chart review completed.  

## 2015-12-17 LAB — OB RESULTS CONSOLE ABO/RH: RH Type: POSITIVE

## 2015-12-17 LAB — OB RESULTS CONSOLE GC/CHLAMYDIA
Chlamydia: NEGATIVE
Gonorrhea: NEGATIVE

## 2015-12-17 LAB — OB RESULTS CONSOLE ANTIBODY SCREEN: Antibody Screen: NEGATIVE

## 2015-12-17 LAB — OB RESULTS CONSOLE RUBELLA ANTIBODY, IGM: Rubella: IMMUNE

## 2015-12-17 LAB — OB RESULTS CONSOLE RPR: RPR: NONREACTIVE

## 2015-12-17 LAB — OB RESULTS CONSOLE HIV ANTIBODY (ROUTINE TESTING): HIV: NONREACTIVE

## 2015-12-17 LAB — OB RESULTS CONSOLE HEPATITIS B SURFACE ANTIGEN: Hepatitis B Surface Ag: NEGATIVE

## 2016-03-28 LAB — OB RESULTS CONSOLE GBS: STREP GROUP B AG: NEGATIVE

## 2016-03-28 LAB — OB RESULTS CONSOLE GC/CHLAMYDIA
Chlamydia: NEGATIVE
Gonorrhea: NEGATIVE

## 2016-04-25 NOTE — L&D Delivery Note (Signed)
Delivery Note Pt suddenly progressed and c/o an urge to push.  I arrived quickly as the head was beginning to crown. At 2:30 AM a viable female was delivered via Vaginal, Spontaneous Delivery (Presentation:LOA, en caul ).The shoulders were not forthcoming, so the membranes were peeled away from the baby and the posterior (left) axilla was grasped with my index finger, and the baby was rotated clockwise into the oblique diameter.  At this point, the (now) anterior shoulder was released, and the baby delivered.  At no time was any traction placed on the baby's head.  Total dystocia ~ 60 SECONDS.   APGAR: 8/9 ; weight  9# 0 oz.  The baby was vigorous at birth.  After 1 minute, the cord was clamped and cut. 40 units of pitocin diluted in 1000cc LR was infused rapidly IV.  The placenta separated spontaneously and delivered via CCT and maternal pushing effort.  It was inspected and appears to be intact with a 3 VC.    Anesthesia:  none Episiotomy:  none Lacerations:  none Suture Repair:  Est. Blood Loss (mL): 200  Mom to postpartum.  Baby to Couplet care / Skin to Skin.  CRESENZO-DISHMAN,Kammi Hechler 04/29/2016, 2:39 AM

## 2016-04-28 ENCOUNTER — Encounter (HOSPITAL_COMMUNITY): Payer: Self-pay | Admitting: *Deleted

## 2016-04-28 ENCOUNTER — Other Ambulatory Visit (HOSPITAL_COMMUNITY): Payer: Self-pay | Admitting: Nurse Practitioner

## 2016-04-28 ENCOUNTER — Inpatient Hospital Stay (HOSPITAL_COMMUNITY)
Admission: AD | Admit: 2016-04-28 | Discharge: 2016-05-01 | DRG: 775 | Disposition: A | Discharge status: Home / Self Care | Payer: Medicaid Other | Source: Ambulatory Visit | Attending: Obstetrics & Gynecology | Admitting: Obstetrics & Gynecology

## 2016-04-28 ENCOUNTER — Ambulatory Visit (HOSPITAL_COMMUNITY)
Admission: RE | Admit: 2016-04-28 | Discharge: 2016-04-28 | Disposition: A | Discharge status: Home / Self Care | Payer: Medicaid Other | Source: Ambulatory Visit | Attending: Nurse Practitioner | Admitting: Nurse Practitioner

## 2016-04-28 ENCOUNTER — Telehealth (HOSPITAL_COMMUNITY): Payer: Self-pay | Admitting: *Deleted

## 2016-04-28 DIAGNOSIS — Z3490 Encounter for supervision of normal pregnancy, unspecified, unspecified trimester: Secondary | ICD-10-CM

## 2016-04-28 DIAGNOSIS — D649 Anemia, unspecified: Secondary | ICD-10-CM | POA: Diagnosis present

## 2016-04-28 DIAGNOSIS — Z3A40 40 weeks gestation of pregnancy: Secondary | ICD-10-CM

## 2016-04-28 DIAGNOSIS — O9902 Anemia complicating childbirth: Secondary | ICD-10-CM | POA: Diagnosis present

## 2016-04-28 DIAGNOSIS — O480 Post-term pregnancy: Secondary | ICD-10-CM | POA: Insufficient documentation

## 2016-04-28 DIAGNOSIS — O4103X0 Oligohydramnios, third trimester, not applicable or unspecified: Principal | ICD-10-CM | POA: Diagnosis present

## 2016-04-28 LAB — CBC
HEMATOCRIT: 27.8 % — AB (ref 36.0–46.0)
HEMOGLOBIN: 8.5 g/dL — AB (ref 12.0–15.0)
MCH: 21.8 pg — AB (ref 26.0–34.0)
MCHC: 30.6 g/dL (ref 30.0–36.0)
MCV: 71.3 fL — AB (ref 78.0–100.0)
Platelets: 204 10*3/uL (ref 150–400)
RBC: 3.9 MIL/uL (ref 3.87–5.11)
RDW: 18.3 % — AB (ref 11.5–15.5)
WBC: 9.6 10*3/uL (ref 4.0–10.5)

## 2016-04-28 LAB — TYPE AND SCREEN
ABO/RH(D): O POS
Antibody Screen: NEGATIVE

## 2016-04-28 MED ORDER — OXYTOCIN 40 UNITS IN LACTATED RINGERS INFUSION - SIMPLE MED
2.5000 [IU]/h | INTRAVENOUS | Status: DC
  Filled 2016-04-28: qty 1000

## 2016-04-28 MED ORDER — LIDOCAINE HCL (PF) 1 % IJ SOLN
30.0000 mL | INTRAMUSCULAR | Status: DC | PRN
  Filled 2016-04-28: qty 30

## 2016-04-28 MED ORDER — OXYTOCIN 40 UNITS IN LACTATED RINGERS INFUSION - SIMPLE MED
1.0000 m[IU]/min | INTRAVENOUS | Status: DC
  Administered 2016-04-28: 2 m[IU]/min via INTRAVENOUS

## 2016-04-28 MED ORDER — OXYTOCIN BOLUS FROM INFUSION: 500.0000 mL | Freq: Once | INTRAVENOUS | Status: DC

## 2016-04-28 MED ORDER — LACTATED RINGERS IV SOLN
INTRAVENOUS | Status: DC
  Administered 2016-04-28 (×2): via INTRAVENOUS

## 2016-04-28 MED ORDER — OXYCODONE-ACETAMINOPHEN 5-325 MG PO TABS: 2.0000 | ORAL_TABLET | ORAL | Status: DC | PRN

## 2016-04-28 MED ORDER — ONDANSETRON HCL 4 MG/2ML IJ SOLN: 4.0000 mg | Freq: Four times a day (QID) | INTRAMUSCULAR | Status: DC | PRN

## 2016-04-28 MED ORDER — TERBUTALINE SULFATE 1 MG/ML IJ SOLN
0.2500 mg | Freq: Once | INTRAMUSCULAR | Status: DC | PRN
  Filled 2016-04-28: qty 1

## 2016-04-28 MED ORDER — OXYCODONE-ACETAMINOPHEN 5-325 MG PO TABS: 1.0000 | ORAL_TABLET | ORAL | Status: DC | PRN

## 2016-04-28 MED ORDER — ACETAMINOPHEN 325 MG PO TABS: 650.0000 mg | ORAL_TABLET | ORAL | Status: DC | PRN

## 2016-04-28 MED ORDER — SOD CITRATE-CITRIC ACID 500-334 MG/5ML PO SOLN
30.0000 mL | ORAL | Status: DC | PRN
Start: 2016-04-28 — End: 2016-04-29

## 2016-04-28 MED ORDER — LACTATED RINGERS IV SOLN: 500.0000 mL | INTRAVENOUS | Status: DC | PRN

## 2016-04-28 MED ORDER — FLEET ENEMA 7-19 GM/118ML RE ENEM: 1.0000 | ENEMA | RECTAL | Status: DC | PRN

## 2016-04-28 NOTE — Progress Notes (Signed)
IOL for oligo.  Ctx q 1 minute, pt rates 6/10.  Pitocin at 10 mu/min. FHR Cat 1.  Cx 3/50/-2 (no change).  Reluctant to increase pitocin d/t tachysystole and reluctant to AROM this early w/oligo.  Will keep pitocin at 10 or decrease if baby becomes stressed.

## 2016-04-28 NOTE — H&P (Signed)
LABOR AND DELIVERY ADMISSION HISTORY AND PHYSICAL NOTE  Stacey Holloway is a 26 y.o. female [phone] with IUP at [redacted]w[redacted]d by [place] presenting for IOL for oligohydramnios seen on [place] today at MFM appointment. AFI was 4.3 cm and BPP 8/8. Denies pain or cramping.   She reports positive fetal movement. She denies leakage of fluid or vaginal bleeding.  Prenatal History/Complications: uncomplicated prenatal course  Past Medical History: Past Medical History:  Diagnosis Date  . Medical history non-contributory   . No pertinent past medical history     Past Surgical History: Past Surgical History:  Procedure Laterality Date  . NO PAST SURGERIES      Obstetrical History: OB History    Gravida Para Term Preterm AB Living   4 3 3  0 0 1   SAB TAB Ectopic Multiple Live Births   0 0 0 0 3      Social History: Social History   Social History  . Marital status: Married    Spouse name: N/A  . Number of children: N/A  . Years of education: N/A   Social History Main Topics  . Smoking status: Never Smoker  . Smokeless tobacco: Never Used  . Alcohol use No  . Drug use: No  . Sexual activity: Yes    Birth control/ protection: None   Other Topics Concern  . None   Social History Narrative  . None    Family History: No family history on file.  Allergies: No Known Allergies  Prescriptions Prior to Admission  Medication Sig Dispense Refill Last Dose  . ibuprofen (ADVIL,MOTRIN) 600 MG tablet Take 1 tablet (600 mg total) by mouth every 6 (six) hours. 30 tablet 0   . norgestimate-ethinyl estradiol (ORTHO-CYCLEN,SPRINTEC,PREVIFEM) 0.25-35 MG-MCG tablet Take 1 tablet by mouth daily. 1 Package 11   . Prenatal Vit-Fe Fumarate-FA (PRENATAL MULTIVITAMIN) TABS tablet Take 1 tablet by mouth daily at 12 noon.   07/24/2014 at Unknown time     Review of Systems   All systems reviewed and negative except as stated in HPI  Blood pressure 115/73, pulse (!) 106, resp. rate 18, height 5' (1.524 m),  weight 86.6 kg (191 lb), unknown if currently breastfeeding. General appearance: alert and cooperative Lungs: clear to auscultation bilaterally Heart: regular rate and rhythm Abdomen: soft, non-tender; bowel sounds normal Extremities: No calf swelling or tenderness Presentation: cephalic  Fetal monitoring: baseline HR 150s Uterine activity:  Dilation: 3 Effacement (%): 50 Station: -2 Exam by:: Donette Larry, cnm   Prenatal labs: ABO, Rh: O/Positive/-- (08/24 0000) Antibody: Negative (08/24 0000) Rubella: Immune RPR: Nonreactive (08/24 0000)  HBsAg: Negative (08/24 0000)  HIV: Non-reactive (08/24 0000)  GBS: Negative (12/04 0000)  1 hr Glucola: 139, nml 3 hr  Genetic screening:  NA Anatomy US:  oligodramnios on 04/28/16   Prenatal Transfer Tool  Maternal Diabetes: No Genetic Screening: NA Maternal Ultrasounds/Referrals: Normal  Fetal Ultrasounds or other Referrals:  Referred to Materal Fetal Medicine  Maternal Substance Abuse:  No Significant Maternal Medications:  None Significant Maternal Lab Results: Lab values include: Other: None  No results found for this or any previous visit (from the past 24 hour(s)).  Patient Active Problem List   Diagnosis Date Noted  . Pregnancy 04/28/2016  . Active labor at term 07/25/2014  . Encounter for suspected problem with amniotic cavity and membrane not found   . [redacted] weeks gestation of pregnancy   . Evaluate anatomy not seen on prior sonogram   . Encounter for fetal  anatomic survey   . Polyhydramnios in second trimester, antepartum complication   . [redacted] weeks gestation of pregnancy   . Insufficient prenatal care 01/15/2012  . Chlamydia infection complicating pregnancy 01/15/2012  . Language barrier 01/15/2012    Assessment: Stacey Holloway is a 26 y.o. [id number] at 2945w5d here for IOL due to oligohydramnios seen on US today. Denies any cramping, abdominal pain, leakage of fluids or bleeding.   #Labor: IOL with pitocin #Pain: IV pain  medication #FWB: Category 1 #ID:  GBS negative #MOF: bottle #MOC: Nexplanon #Circ:  No circ  Renne Muscaaniel L Warden, MD PGY-1 04/28/2016, 4:40 PM  Midwife attestation: I have seen and examined this patient; I agree with above documentation in the resident's note.   Stacey Holloway is a 26 y.o. [phone] here for IOL for oligo  PE: Gen: calm comfortable, NAD Resp: normal effort, no distress Abd: gravid  ROS, labs, PMH reviewed  Assessment/Plan: Admit to LD Labor: IOL FWB: Cat I ID: GBS neg  Donette LarryMelanie Everlynn Sagun, CNM  04/28/2016, 5:55 PM

## 2016-04-28 NOTE — Progress Notes (Signed)
Used interpreter through Ipad for admission with RN and CNM

## 2016-04-28 NOTE — Progress Notes (Signed)
Patient was not admitted properly into OBIX, therefore her FHR tracing  From [phone] was stored under another patient with the medical record number of [id number].  Pt was properly admitted into OBIX at 1815.

## 2016-04-28 NOTE — Telephone Encounter (Signed)
Preadmission screen [phone] interpreter number

## 2016-04-28 NOTE — Progress Notes (Signed)
Labor Progress Note Stacey PorchKriti Timberlake is a 26 y.o. G4P3001 at 7746w5d presented for IOL for oligo.  S:  Comfortable, starting to feel mild ctx.   O:  BP 109/74   Pulse 95   Resp 18   Ht 5' (1.524 m)   Wt 86.6 kg (191 lb)   BMI 37.30 kg/m  EFM: baseline 145 bpm/ mod variability/ + accels/ no decels  Toco: 2-3 SVE: Dilation: 3 Effacement (%): 50 Station: -2 Presentation: Vertex Exam by:: Donette LarryMelanie Ciarra Braddy, cnm Pitocin: 6 mu/min  A/P: 26 y.o. G4P3001 4146w5d  1. Labor: latent 2. FWB: Cat I 3. Pain: analgesia prn Continue Pitocin titration. Anticipate SVD.  Donette LarryMelanie Nahdia Doucet, CNM 6:51 PM

## 2016-04-29 ENCOUNTER — Encounter (HOSPITAL_COMMUNITY): Payer: Self-pay

## 2016-04-29 DIAGNOSIS — Z3A40 40 weeks gestation of pregnancy: Secondary | ICD-10-CM

## 2016-04-29 LAB — RPR: RPR: NONREACTIVE

## 2016-04-29 MED ORDER — SIMETHICONE 80 MG PO CHEW: 80.0000 mg | CHEWABLE_TABLET | ORAL | Status: DC | PRN

## 2016-04-29 MED ORDER — DOCUSATE SODIUM 100 MG PO CAPS: 100.0000 mg | ORAL_CAPSULE | Freq: Two times a day (BID) | ORAL | Status: DC

## 2016-04-29 MED ORDER — FERROUS SULFATE 325 (65 FE) MG PO TABS
325.0000 mg | ORAL_TABLET | Freq: Two times a day (BID) | ORAL | Status: DC
  Administered 2016-04-29 – 2016-05-01 (×5): 325 mg via ORAL
  Filled 2016-04-29 (×5): qty 1

## 2016-04-29 MED ORDER — ONDANSETRON HCL 4 MG PO TABS: 4.0000 mg | ORAL_TABLET | ORAL | Status: DC | PRN

## 2016-04-29 MED ORDER — OXYCODONE HCL 5 MG PO TABS: 10.0000 mg | ORAL_TABLET | ORAL | Status: DC | PRN

## 2016-04-29 MED ORDER — FENTANYL CITRATE (PF) 100 MCG/2ML IJ SOLN
50.0000 ug | INTRAMUSCULAR | Status: DC | PRN
  Administered 2016-04-29 (×2): 50 ug via INTRAVENOUS
  Filled 2016-04-29: qty 2

## 2016-04-29 MED ORDER — WITCH HAZEL-GLYCERIN EX PADS: 1.0000 "application " | MEDICATED_PAD | CUTANEOUS | Status: DC | PRN

## 2016-04-29 MED ORDER — ONDANSETRON HCL 4 MG/2ML IJ SOLN: 4.0000 mg | INTRAMUSCULAR | Status: DC | PRN

## 2016-04-29 MED ORDER — METHYLERGONOVINE MALEATE 0.2 MG/ML IJ SOLN: 0.2000 mg | INTRAMUSCULAR | Status: DC | PRN

## 2016-04-29 MED ORDER — DIBUCAINE 1 % RE OINT: 1.0000 "application " | TOPICAL_OINTMENT | RECTAL | Status: DC | PRN

## 2016-04-29 MED ORDER — IBUPROFEN 600 MG PO TABS
600.0000 mg | ORAL_TABLET | Freq: Four times a day (QID) | ORAL | Status: DC
  Administered 2016-04-29 – 2016-05-01 (×10): 600 mg via ORAL
  Filled 2016-04-29 (×10): qty 1

## 2016-04-29 MED ORDER — PRENATAL MULTIVITAMIN CH
1.0000 | ORAL_TABLET | Freq: Every day | ORAL | Status: DC
  Administered 2016-04-29 – 2016-05-01 (×3): 1 via ORAL
  Filled 2016-04-29 (×3): qty 1

## 2016-04-29 MED ORDER — ZOLPIDEM TARTRATE 5 MG PO TABS: 5.0000 mg | ORAL_TABLET | Freq: Every evening | ORAL | Status: DC | PRN

## 2016-04-29 MED ORDER — DIPHENHYDRAMINE HCL 25 MG PO CAPS: 25.0000 mg | ORAL_CAPSULE | Freq: Four times a day (QID) | ORAL | Status: DC | PRN

## 2016-04-29 MED ORDER — BISACODYL 10 MG RE SUPP: 10.0000 mg | Freq: Every day | RECTAL | Status: DC | PRN

## 2016-04-29 MED ORDER — COCONUT OIL OIL: 1.0000 "application " | TOPICAL_OIL | Status: DC | PRN

## 2016-04-29 MED ORDER — OXYCODONE HCL 5 MG PO TABS: 5.0000 mg | ORAL_TABLET | ORAL | Status: DC | PRN

## 2016-04-29 MED ORDER — BENZOCAINE-MENTHOL 20-0.5 % EX AERO: 1.0000 "application " | INHALATION_SPRAY | CUTANEOUS | Status: DC | PRN

## 2016-04-29 MED ORDER — MEASLES, MUMPS & RUBELLA VAC ~~LOC~~ INJ
0.5000 mL | INJECTION | Freq: Once | SUBCUTANEOUS | Status: DC
  Filled 2016-04-29: qty 0.5

## 2016-04-29 MED ORDER — FLEET ENEMA 7-19 GM/118ML RE ENEM: 1.0000 | ENEMA | Freq: Every day | RECTAL | Status: DC | PRN

## 2016-04-29 MED ORDER — ACETAMINOPHEN 325 MG PO TABS
650.0000 mg | ORAL_TABLET | ORAL | Status: DC | PRN
  Administered 2016-04-29: 650 mg via ORAL
  Filled 2016-04-29: qty 2

## 2016-04-29 MED ORDER — TETANUS-DIPHTH-ACELL PERTUSSIS 5-2.5-18.5 LF-MCG/0.5 IM SUSP: 0.5000 mL | Freq: Once | INTRAMUSCULAR | Status: DC

## 2016-04-29 MED ORDER — DOCUSATE SODIUM 100 MG PO CAPS
100.0000 mg | ORAL_CAPSULE | Freq: Two times a day (BID) | ORAL | Status: DC
  Administered 2016-04-30 – 2016-05-01 (×4): 100 mg via ORAL
  Filled 2016-04-29 (×4): qty 1

## 2016-04-29 MED ORDER — METHYLERGONOVINE MALEATE 0.2 MG PO TABS: 0.2000 mg | ORAL_TABLET | ORAL | Status: DC | PRN

## 2016-04-29 NOTE — Progress Notes (Signed)
Language line used to provide education to patient, update feedings, explain plan of care, and answer questions.  Explained bath and skin to skin after bath as well as possible reasons for baby spitting up.  Discussed how much formula to give infant at each feeding.  Breakfast for patient ordered as well.  Will continue to monitor.   Vivi MartensAshley Dawana Asper RN

## 2016-04-30 ENCOUNTER — Encounter (HOSPITAL_COMMUNITY): Payer: Self-pay

## 2016-04-30 NOTE — Lactation Note (Signed)
This note was copied from a baby's chart. Lactation Consultation Note  Patient Name: Stacey Holloway: 04/30/2016 Reason for consult: Initial assessment   Initial consult of Exp BF mom of 7538 hour old infant who has decided to put infant to breast. Sp[facility] with mom with assistance of [facility] interpreter [place] # [phone].   Mom reports she BF her older child for 2 years without supplement. She reports she wants to give this infant breast milk and formula. Discussed supply and demand and enc mom to feed infant 8-12 x in 24 hours at first feeding cues. Enc mom to massage/compress breast with feeding. Mom was able to hand express and colostrum was easy to express. Enc mom to BF prior to giving formula to stimulate her supply.   Infant awakened while I was in the room. Mom checked his diaper and latched him to the right breast in the cradle hold. Mom with large everted nipples. Infant latched on well with rhythmic suckles and intermittent swallows. Mom reports she did not need BF assistance at this time. Mom only left infant latched for 10 minutes. Infant was bundled in a thick fleece sleeper and 1 thick blanket on him with feeding. Enc mom to feed infant STS.   Discussed mom using pump to give infant EBM. Mom reports she wants manual pump not electric. Manual pump was given with instructions for use and cleaning. Mom reports [facility]. BF Resources Handout and [facility] Brochure given, mom informed of IP/OP Services, BF Support Groups and LC phone #. Enc mom to call out to desk for feeding assistance as needed.   When I was leaving the room after interpreter hung up, mom said "excuse me, I need Formula please". Report was given to Milagros Reaponna Esker, RN.    Maternal Data Formula Feeding for Exclusion: Yes Reason for exclusion: Mother's choice to formula feed on admision (changed to breast/formula on 1/6) Has patient been taught Hand Expression?: Yes Does the  patient have breastfeeding experience prior to this delivery?: Yes  Feeding Feeding Type: Breast Fed Length of feed: 10 min  LATCH Score/Interventions Latch: Grasps breast easily, tongue down, lips flanged, rhythmical sucking. Intervention(s): Adjust position;Assist with latch;Breast massage;Breast compression  Audible Swallowing: A few with stimulation Intervention(s): Alternate breast massage;Hand expression;Skin to skin  Type of Nipple: Everted at rest and after stimulation  Comfort (Breast/Nipple): Soft / non-tender     Hold (Positioning): No assistance needed to correctly position infant at breast.  LATCH Score: 9  Lactation Tools Discussed/Used WIC Program: No (Plans to apply) Pump Review: Setup, frequency, and cleaning Initiated by:: Stacey StainSharon Sundy Houchins, RN, IBCLC Holloway initiated:: 04/30/16   Consult Status Consult Status: Follow-up Holloway: 05/01/16 Follow-up type: In-patient    Silas FloodSharon S Averey Koning 04/30/2016, 5:03 PM

## 2016-04-30 NOTE — Progress Notes (Signed)
Post Partum Day 1 Subjective: no complaints, up ad lib, voiding, tolerating PO and has not yet had a bowel movement. moderate abdominal pain but decreasing. no concerns at this time  Objective: Blood pressure 104/67, pulse 82, temperature 98.6 F (37 C), temperature source Oral, resp. rate 16, height 5' (1.524 m), weight 86.6 kg (191 lb), unknown if currently breastfeeding.  Physical Exam:  General: alert, cooperative and no distress Lochia: appropriate Uterine Fundus: firm Incision: N/A DVT Evaluation: No evidence of DVT seen on physical exam.   Recent Labs  04/28/16 1615  HGB 8.5*  HCT 27.8*    Assessment/Plan: Plan for discharge tomorrow and Contraception nexplanon  Planning on bottle feeding exclusively No circ   LOS: 2 days   Stacey Holloway Stacey Holloway 04/30/2016, 7:49 AM  Patient ID: Stacey Holloway, female   DOB: 06/20/1990, 26 y.o.   MRN: [id number]

## 2016-04-30 NOTE — Progress Notes (Signed)
Post Partum Day #1  Subjective: no complaints, up ad lib, voiding and tolerating PO  Objective: Blood pressure 104/67, pulse 82, temperature 98.6 F (37 C), temperature source Oral, resp. rate 16, height 5' (1.524 m), weight 86.6 kg (191 lb), unknown if currently breastfeeding.  Physical Exam:  General: alert, cooperative and no distress Lochia: appropriate Uterine Fundus: firm Incision: NA DVT Evaluation: No evidence of DVT seen on physical exam.   Recent Labs  04/28/16 1615  HGB 8.5*  HCT 27.8*    Assessment/Plan: Plan for discharge tomorrow Baby is jaundiced; being seen by peds. Breast and bottle feeding.   LOS: 2 days   Marylene LandKathryn Lorraine Clarece Drzewiecki CNM 04/30/2016, 12:35 PM

## 2016-05-01 MED ORDER — IBUPROFEN 600 MG PO TABS: 600.0000 mg | ORAL_TABLET | Freq: Four times a day (QID) | ORAL | 0 refills | Status: AC | PRN

## 2016-05-01 MED ORDER — FERROUS SULFATE 325 (65 FE) MG PO TABS: 325.0000 mg | ORAL_TABLET | Freq: Two times a day (BID) | ORAL | 3 refills | Status: AC

## 2016-05-01 NOTE — Lactation Note (Signed)
This note was copied from a baby's chart. Lactation Consultation Note  Patient Name: Stacey Holloway ZOXWR'UToday's Date: 05/01/2016 Reason for consult: Follow-up assessment;Infant weight loss (2% weight loss, unable to assess latch - just fed ) Baby is 57 hours old and has been mostly bottle feeding , and formula feeding.  LC updated doc flow sheets per mom and dad.  Sore nipples and engorgement prevention and tx reviewed.  Mom already has a hand pump  Mother informed of post-discharge support and given phone number to the lactation department, including services for phone call assistance; out-patient appointments; and breastfeeding support group. List of other breastfeeding resources in the community given in the handout. Encouraged mother to call for problems or concerns related to breastfeeding.  Maternal Data    Feeding Feeding Type: Breast Fed Length of feed: 5 min (per mom )  LATCH Score/Interventions                      Lactation Tools Discussed/Used Tools: Pump Breast pump type: Manual (mom already has a hand pump )   Consult Status Consult Status: Complete Date: 05/01/16    [name] 05/01/2016, 12:04 PM

## 2016-05-01 NOTE — Discharge Instructions (Signed)

## 2016-05-01 NOTE — Plan of Care (Signed)
Problem: Bowel/Gastric: Goal: Will not experience complications related to bowel motility Outcome: Progressing Patient passing flatus

## 2016-05-01 NOTE — Discharge Summary (Signed)
OB Discharge Summary     Patient Name: Stacey Holloway DOB: 01/05/1991 MRN: [id number]  Date of admission: 04/28/2016 Delivering MD: Jacklyn ShellRESENZO-DISHMON, FRANCES   Date of discharge: 05/01/2016  Admitting diagnosis: direct admit for IOL due to oligohydramnios Intrauterine pregnancy: 279w6d     Secondary diagnosis:  none Additional problems: none     Discharge diagnosis: Term Pregnancy Delivered and Anemia                                                                                                Post partum procedures:none  Augmentation: Pitocin  Complications: None  Hospital course:  Induction of Labor With Vaginal Delivery   26 y.o. yo [id number] at 539w6d was admitted to the hospital 04/28/2016 for induction of labor.  Indication for induction: oligohydramnios.  Patient progressed to complete and at delivery had a shoulder dystocia lasting 1 minute- see Delivery Note. Membrane Rupture Time/Date: 2:30 AM ,04/29/2016   Intrapartum Procedures: Episiotomy: None [1]                                         Lacerations:  None [1]  Patient had delivery of a Viable infant.  Information for the patient's newborn:  Arty BaumgartnerRai, Boy Tieisha [[id number]][[id number]]  Delivery Method: Vaginal, Spontaneous Delivery (Filed from Delivery Summary)   04/29/2016  Details of delivery can be found in separate delivery note.  Patient had a routine postpartum course. Patient is discharged home 05/01/16.   Physical exam Vitals:   04/29/16 1744 04/30/16 0625 04/30/16 1758 05/01/16 0538  BP: 100/63 104/67 108/61 117/68  Pulse: 90 82 78 75  Resp: 18 16 18 18   Temp: 98.5 F (36.9 C) 98.6 F (37 C) 98.3 F (36.8 C) 97.9 F (36.6 C)  TempSrc: Oral Oral Oral Oral  Weight:      Height:       General: alert and cooperative Lochia: appropriate Uterine Fundus: firm Incision: N/A DVT Evaluation: No evidence of DVT seen on physical exam. Labs: Lab Results  Component Value Date   WBC 9.6 04/28/2016   HGB 8.5 (L) 04/28/2016   HCT  27.8 (L) 04/28/2016   MCV 71.3 (L) 04/28/2016   PLT 204 04/28/2016   CMP Latest Ref Rng & Units 09/28/2013  Glucose 70 - 99 mg/dL 93  BUN 6 - 23 mg/dL 3(L)  Creatinine 6.960.50 - 1.10 mg/dL 2.950.59  Sodium 284137 - 132147 mEq/L 141  Potassium 3.7 - 5.3 mEq/L 4.0  Chloride 96 - 112 mEq/L 107  CO2 19 - 32 mEq/L 23  Calcium 8.4 - 10.5 mg/dL 8.5    Discharge instruction: per After Visit Summary and "Baby and Me Booklet".  After visit meds:  Allergies as of 05/01/2016   No Known Allergies     Medication List    TAKE these medications   ferrous sulfate 325 (65 FE) MG tablet Take 1 tablet (325 mg total) by mouth 2 (two) times daily with a meal.   ibuprofen 600 MG tablet Commonly known as:  ADVIL,MOTRIN Take 1 tablet (600 mg total) by mouth every 6 (six) hours as needed.   prenatal multivitamin Tabs tablet Take 1 tablet by mouth daily at 12 noon.       Diet: routine diet  Activity: Advance as tolerated. Pelvic rest for 6 weeks.   Outpatient follow up:6 weeks Follow up Appt:Future Appointments Date Time Provider Department Center  05/03/2016 12:00 AM [facility] ROOM [facility] None   Follow up Visit:No Follow-up on file.  Postpartum contraception: Nexplanon  Newborn Data: Live born female  Birth Weight: 9 lb 0.1 oz (4085 g) APGAR: 8, 9  Baby Feeding: Bottle Disposition:home with mother   05/01/2016 Cam Hai, CNM  9:29 AM

## 2016-05-02 NOTE — Clinical Social Work Maternal (Signed)
CLINICAL SOCIAL WORK MATERNAL/CHILD NOTE  Patient Details  Name: Felizardo Hoffmann MRN: [id number] Date of Birth: 04/09/1991  Date:  March 30, 2017  Clinical Social Worker Initiating Note:  Terri Piedra, [place] Date/ Time Initiated:  05/01/16/1000     Child's Name:  Larey Seat   Legal Guardian:  Other (Comment) (Parents: Felizardo Hoffmann and Quintella Baton)   Need for Interpreter:  Other (Comment Required) Rosalva Ferron)   Date of Referral:  2017-01-29     Reason for Referral:  Other (Comment) (Loss of children)   Referral Source:  [employer]   Address:  [address] [place], [place] [address]  Phone number:  [phone]   Household Members:  Minor Children, Spouse (Couple has one other living child-daughter/age 21)   Natural Supports (not living in the home):  Friends, Immediate Family, Extended Family   Professional Supports: None   Employment:     Type of Work:     Education:      Museum/gallery curator Resources:  [employer]   Other Resources:      Cultural/Religious Considerations Which May Impact Care: None stated.  Strengths:      Risk Factors/Current Problems:  Other (Comment) (Hx of loss)   Cognitive State:  Alert , Able to Concentrate , Linear Thinking    Mood/Affect:  Calm , Flat    CSW Assessment: CSW met with parents and MOB's brother and sister to offer support and complete assessment due to history of loss.  CSW reviewed medical records prior to meeting with MOB and notes that it appears that one child died from drowning and the other possibly from [place].  CSW utilized Publishing rights manager through [employer] to communicate with family.  Family was pleasant and welcoming, though very quiet.  It was apparent that they are able to understand some [place].  MOB asked that her family stay while meeting with CSW and gave permission to speak openly. MOB was holding infant while sitting up in bed.  She reports that she and infant are doing well.  Family reports that they have all  necessary supplies for infant at home and parents feel they have a very good support system.  Parents live with their 54 year old child and "Gramma and Grandpa."  (CSW is unsure which set of grandparents live in the home.)   CSW inquired about MOB's [facility] and informed family of hospital drug screen policy.  [facility]  CSW explained since we have records starting from 36 weeks, that baby was drug screened.  Parents stated understanding and report no concerns.  MOB denies drug use.  When asked if they have any questions, MOB's brother said, "the baby looks sick.  He has been vomiting.  Do you think it is safe for him to go home?"  CSW informed family that they will need to speak with the pediatrician about this and made sure that pediatric staff was aware of family's concern.   CSW gently and sensitively inquired about the couples other children.  FOB explained that they have had four children, and that two of them have died.  FOB states both deaths were accidental and that their 27 year old son died from drowning while swimming outside approximately 3 years ago and then their 79 day old baby died from [place] in 07/18/2014.  CSW asked about grief counseling and they report that they have been offered counseling, but feel they are "stable" and do not wish  to seek counseling.  CSW provided them with information for [facility] who has access to interpreters if they feel they would like to seek counseling at any time.  Parents were appreciative.  Parents were attentive as CSW provided education regarding PMADs and SIDS.   Parents report having all necessary supplies for infant and state no further questions, concerns or need for CSW intervention.  CSW identifies no barriers to discharge.  CSW Plan/Description:  No Further Intervention Required/No Barriers to Discharge, Patient/Family Education , Information/Referral to Community Resources     Cristy Colmenares,  Willman Cuny Elizabeth, LCSW 05/01/2016, 10:00 AM 

## 2016-05-03 ENCOUNTER — Inpatient Hospital Stay (HOSPITAL_COMMUNITY): Admission: RE | Admit: 2016-05-03 | Payer: Medicaid Other | Source: Ambulatory Visit

## 2016-06-01 ENCOUNTER — Ambulatory Visit: Payer: Medicaid Other | Admitting: Family Medicine

## 2018-10-16 IMAGING — US US MFM FETAL BPP W/O NON-STRESS
1 series · 14 of 20 positions shown · non-contrast
Comparison: none

[Series 1: us mfm fetal bpp w/o non-stress · 20 acquisitions, 14 frames shown]
[im 1/20]
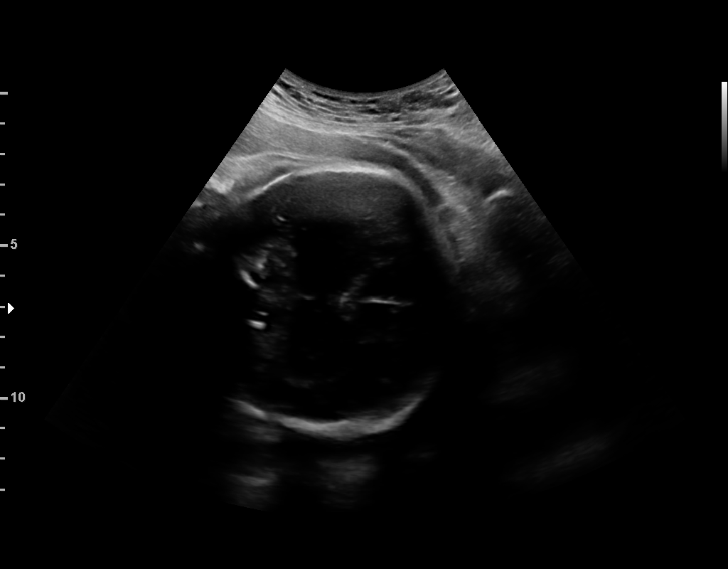
[im 3/20]
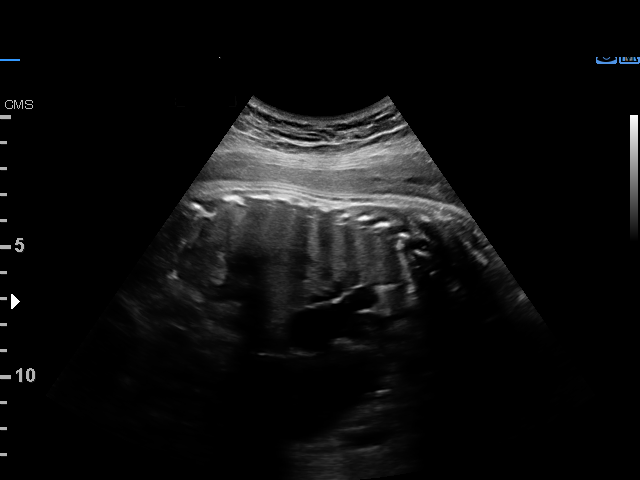
[im 4/20]
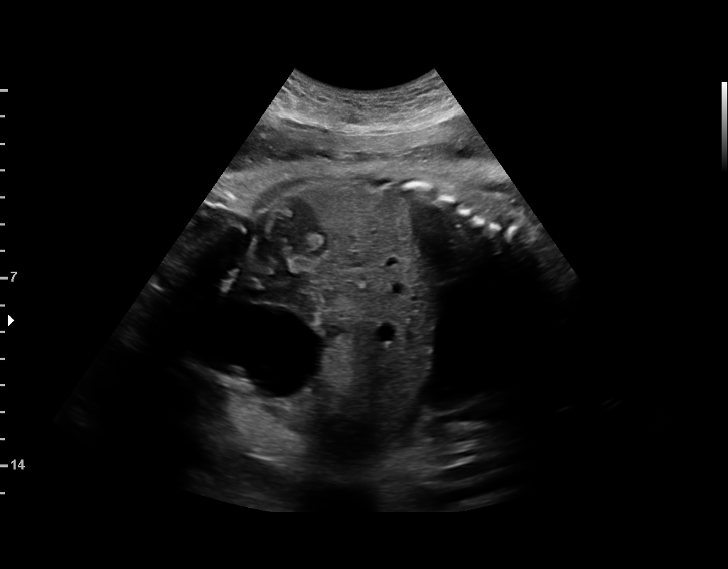
[im 6/20]
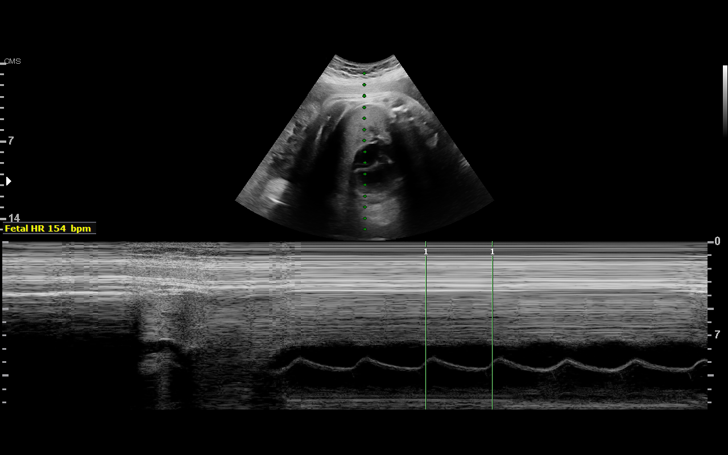
[im 7/20]
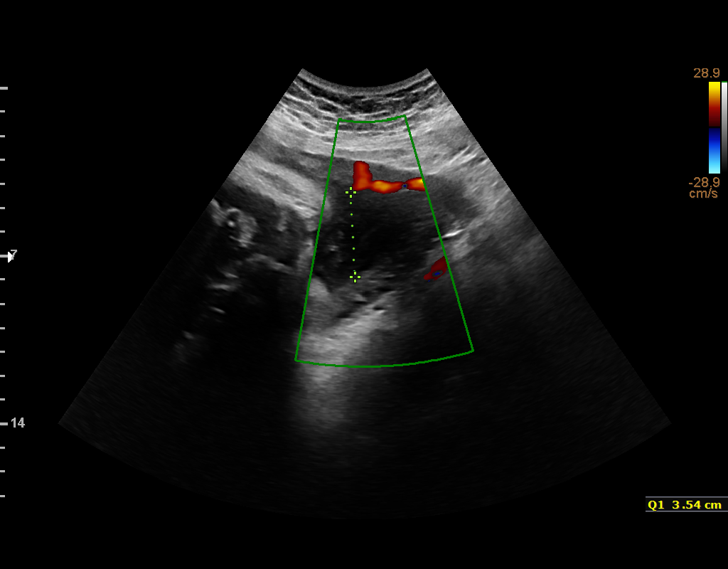
[im 8/20]
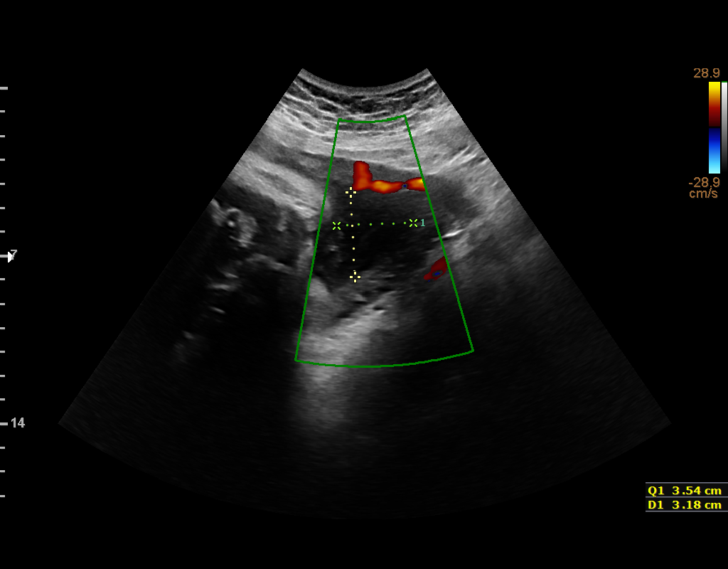
[im 10/20]
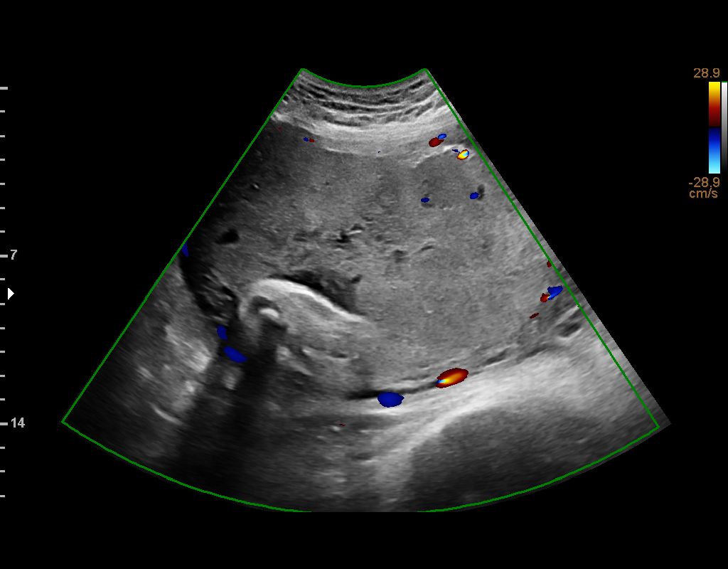
[im 11/20]
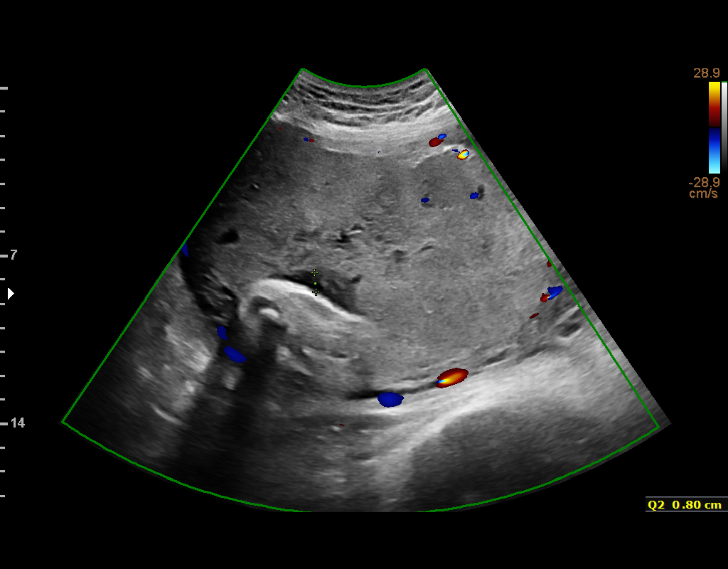
[im 13/20]
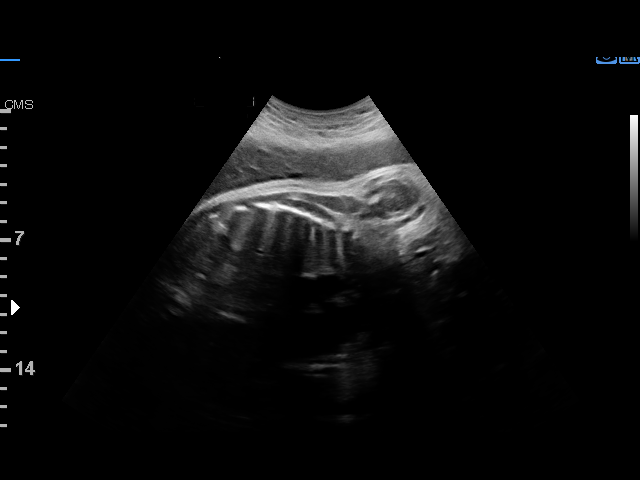
[im 14/20]
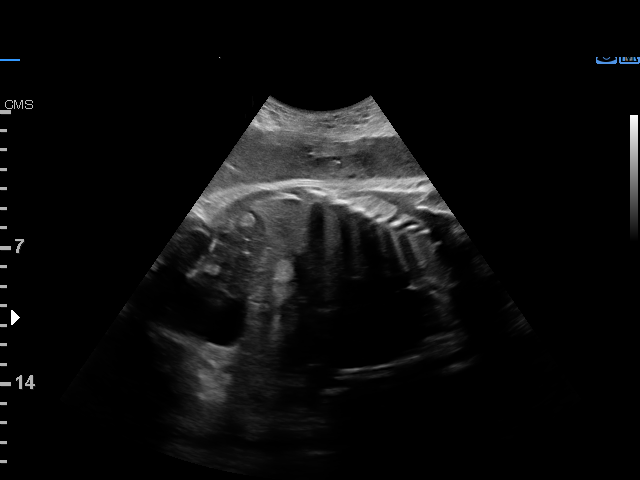
[im 16/20]
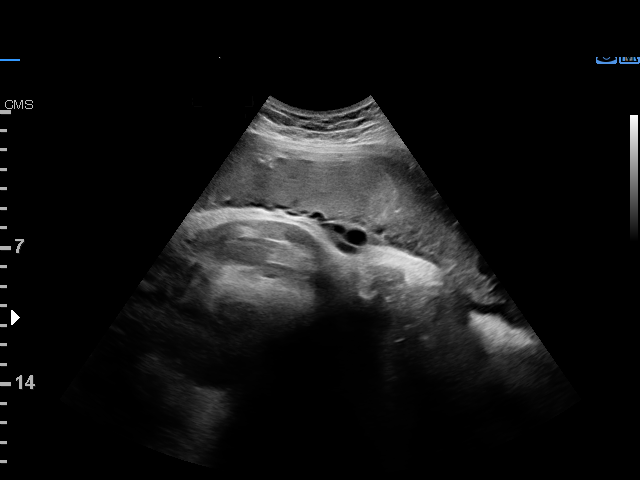
[im 17/20]
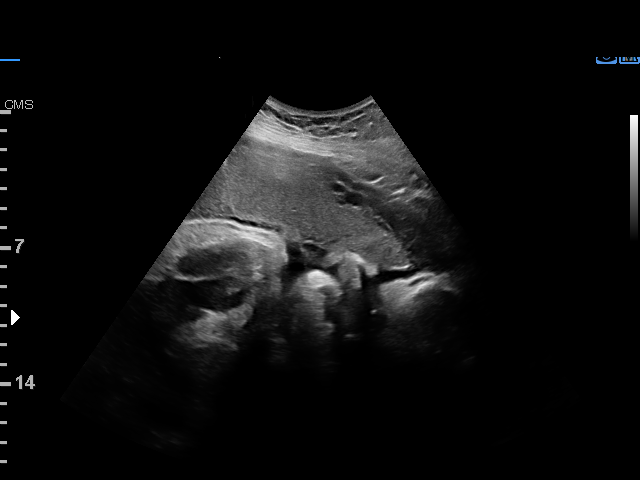
[im 18/20]
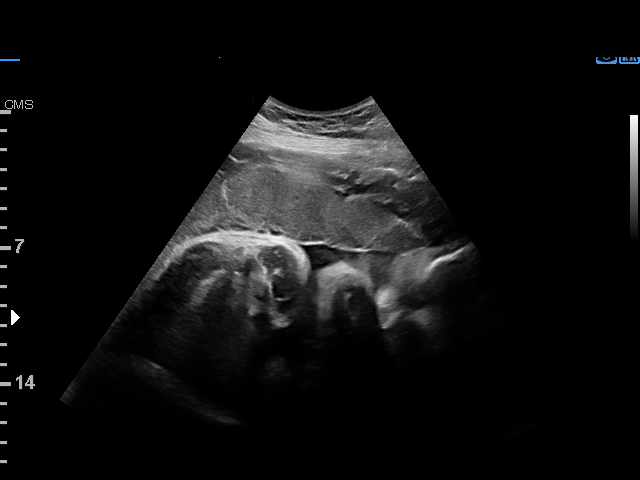
[im 20/20]
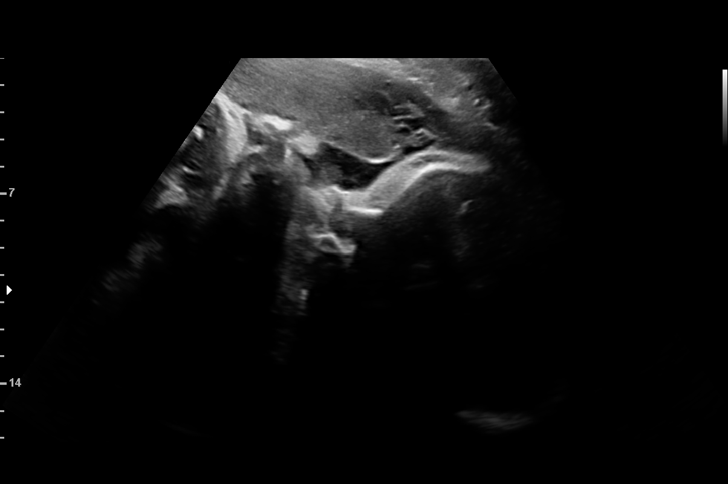

[14 of 20 positions shown; findings below may reference images not displayed]

[REDACTED]-
Faculty Physician
IOHONG NP

1  MARYAMOU BARDE           [id number]      [phone]     [phone]
Indications

40 weeks gestation of pregnancy
Postdate pregnancy (40-42 weeks)
OB History

Gravidity:    2         Term:   1        Prem:   0        SAB:   0
TOP:          0       Ectopic:  0        Living: 1
Fetal Evaluation

Num Of Fetuses:     1
Fetal Heart         154
Rate(bpm):
Cardiac Activity:   Observed
Presentation:       Cephalic

Amniotic Fluid
AFI FV:      Oligohydramnios

AFI Sum(cm)     %Tile       Largest Pocket(cm)
4.34            < 3

RUQ(cm)                     LUQ(cm)
3.54
Biophysical Evaluation
Amniotic F.V:   Pocket => 2 cm two         F. Tone:        Observed
planes
F. Movement:    Observed                   Score:          [DATE]
F. Breathing:   Observed
Gestational Age

Clinical EDD:  40w 5d                                        EDD:   04/23/16
Best:          40w 5d     Det. By:  Clinical EDD             EDD:   04/23/16
Impression

Single IUP at 40w 5d
BPP [DATE]
Cephalic presentation
Oligohydramnios is noted with an AFI of 4.3 cm.
Recommendations

Given current gestational age, recommend delivery.
